# Patient Record
Sex: Female | Born: 1940 | Race: White | Hispanic: No | Smoking: Never smoker
Health system: Southern US, Community
[De-identification: ages and names within clinical notes are randomized; demographics above are authoritative.]

## PROBLEM LIST (undated history)

## (undated) HISTORY — PX: OTHER SURGICAL HISTORY: SHX169

## (undated) HISTORY — PX: ABDOMINAL HYSTERECTOMY: SHX81

## (undated) HISTORY — PX: REPLACEMENT TOTAL KNEE BILATERAL: SUR1225

---

## 2015-08-20 DIAGNOSIS — R5381 Other malaise: Secondary | ICD-10-CM | POA: Insufficient documentation

## 2015-08-20 DIAGNOSIS — E538 Deficiency of other specified B group vitamins: Secondary | ICD-10-CM | POA: Insufficient documentation

## 2015-08-20 DIAGNOSIS — I10 Essential (primary) hypertension: Secondary | ICD-10-CM | POA: Insufficient documentation

## 2015-08-20 DIAGNOSIS — E669 Obesity, unspecified: Secondary | ICD-10-CM | POA: Insufficient documentation

## 2015-08-20 DIAGNOSIS — M51369 Other intervertebral disc degeneration, lumbar region without mention of lumbar back pain or lower extremity pain: Secondary | ICD-10-CM | POA: Insufficient documentation

## 2015-08-20 DIAGNOSIS — K219 Gastro-esophageal reflux disease without esophagitis: Secondary | ICD-10-CM | POA: Insufficient documentation

## 2015-08-20 DIAGNOSIS — M7138 Other bursal cyst, other site: Secondary | ICD-10-CM | POA: Insufficient documentation

## 2015-08-20 DIAGNOSIS — F419 Anxiety disorder, unspecified: Secondary | ICD-10-CM | POA: Insufficient documentation

## 2015-08-20 DIAGNOSIS — E782 Mixed hyperlipidemia: Secondary | ICD-10-CM | POA: Insufficient documentation

## 2015-08-20 DIAGNOSIS — M5136 Other intervertebral disc degeneration, lumbar region: Secondary | ICD-10-CM | POA: Insufficient documentation

## 2015-08-20 DIAGNOSIS — Z79899 Other long term (current) drug therapy: Secondary | ICD-10-CM | POA: Insufficient documentation

## 2015-08-20 DIAGNOSIS — M159 Polyosteoarthritis, unspecified: Secondary | ICD-10-CM | POA: Insufficient documentation

## 2015-08-20 DIAGNOSIS — I493 Ventricular premature depolarization: Secondary | ICD-10-CM | POA: Insufficient documentation

## 2015-08-20 DIAGNOSIS — Z8673 Personal history of transient ischemic attack (TIA), and cerebral infarction without residual deficits: Secondary | ICD-10-CM | POA: Insufficient documentation

## 2015-08-20 DIAGNOSIS — R5383 Other fatigue: Secondary | ICD-10-CM | POA: Insufficient documentation

## 2015-09-11 DIAGNOSIS — E611 Iron deficiency: Secondary | ICD-10-CM | POA: Insufficient documentation

## 2016-03-05 DIAGNOSIS — E860 Dehydration: Secondary | ICD-10-CM | POA: Diagnosis not present

## 2016-03-05 DIAGNOSIS — C4361 Malignant melanoma of right upper limb, including shoulder: Secondary | ICD-10-CM | POA: Diagnosis not present

## 2016-09-02 DIAGNOSIS — Z1231 Encounter for screening mammogram for malignant neoplasm of breast: Secondary | ICD-10-CM | POA: Diagnosis not present

## 2016-09-08 DIAGNOSIS — Z85820 Personal history of malignant melanoma of skin: Secondary | ICD-10-CM | POA: Diagnosis not present

## 2016-09-10 DIAGNOSIS — M1711 Unilateral primary osteoarthritis, right knee: Secondary | ICD-10-CM | POA: Diagnosis not present

## 2016-09-29 DIAGNOSIS — M1711 Unilateral primary osteoarthritis, right knee: Secondary | ICD-10-CM | POA: Diagnosis not present

## 2016-10-06 DIAGNOSIS — M1711 Unilateral primary osteoarthritis, right knee: Secondary | ICD-10-CM | POA: Diagnosis not present

## 2016-10-07 DIAGNOSIS — R5381 Other malaise: Secondary | ICD-10-CM | POA: Diagnosis not present

## 2016-10-07 DIAGNOSIS — D649 Anemia, unspecified: Secondary | ICD-10-CM | POA: Diagnosis not present

## 2016-10-07 DIAGNOSIS — E782 Mixed hyperlipidemia: Secondary | ICD-10-CM | POA: Diagnosis not present

## 2016-10-07 DIAGNOSIS — Z79899 Other long term (current) drug therapy: Secondary | ICD-10-CM | POA: Diagnosis not present

## 2016-10-07 DIAGNOSIS — R7303 Prediabetes: Secondary | ICD-10-CM | POA: Insufficient documentation

## 2016-10-07 DIAGNOSIS — I493 Ventricular premature depolarization: Secondary | ICD-10-CM | POA: Diagnosis not present

## 2016-10-07 DIAGNOSIS — Z8673 Personal history of transient ischemic attack (TIA), and cerebral infarction without residual deficits: Secondary | ICD-10-CM | POA: Diagnosis not present

## 2016-10-07 DIAGNOSIS — E538 Deficiency of other specified B group vitamins: Secondary | ICD-10-CM | POA: Diagnosis not present

## 2016-10-07 DIAGNOSIS — M150 Primary generalized (osteo)arthritis: Secondary | ICD-10-CM | POA: Diagnosis not present

## 2016-10-07 DIAGNOSIS — I10 Essential (primary) hypertension: Secondary | ICD-10-CM | POA: Diagnosis not present

## 2016-10-07 DIAGNOSIS — K219 Gastro-esophageal reflux disease without esophagitis: Secondary | ICD-10-CM | POA: Diagnosis not present

## 2016-10-07 DIAGNOSIS — F411 Generalized anxiety disorder: Secondary | ICD-10-CM | POA: Diagnosis not present

## 2016-10-07 DIAGNOSIS — M5136 Other intervertebral disc degeneration, lumbar region: Secondary | ICD-10-CM | POA: Diagnosis not present

## 2016-10-07 DIAGNOSIS — E669 Obesity, unspecified: Secondary | ICD-10-CM | POA: Diagnosis not present

## 2016-10-07 DIAGNOSIS — R5383 Other fatigue: Secondary | ICD-10-CM | POA: Diagnosis not present

## 2016-10-15 DIAGNOSIS — M1711 Unilateral primary osteoarthritis, right knee: Secondary | ICD-10-CM | POA: Diagnosis not present

## 2016-11-06 DIAGNOSIS — G453 Amaurosis fugax: Secondary | ICD-10-CM | POA: Diagnosis not present

## 2016-11-06 DIAGNOSIS — H35031 Hypertensive retinopathy, right eye: Secondary | ICD-10-CM | POA: Diagnosis not present

## 2017-03-09 DIAGNOSIS — R918 Other nonspecific abnormal finding of lung field: Secondary | ICD-10-CM | POA: Diagnosis not present

## 2017-03-09 DIAGNOSIS — C4361 Malignant melanoma of right upper limb, including shoulder: Secondary | ICD-10-CM | POA: Diagnosis not present

## 2017-03-11 DIAGNOSIS — D649 Anemia, unspecified: Secondary | ICD-10-CM | POA: Diagnosis not present

## 2017-03-11 DIAGNOSIS — Z85820 Personal history of malignant melanoma of skin: Secondary | ICD-10-CM | POA: Diagnosis not present

## 2017-04-06 DIAGNOSIS — I10 Essential (primary) hypertension: Secondary | ICD-10-CM | POA: Diagnosis not present

## 2017-04-06 DIAGNOSIS — E538 Deficiency of other specified B group vitamins: Secondary | ICD-10-CM | POA: Diagnosis not present

## 2017-04-06 DIAGNOSIS — K219 Gastro-esophageal reflux disease without esophagitis: Secondary | ICD-10-CM | POA: Diagnosis not present

## 2017-04-06 DIAGNOSIS — E782 Mixed hyperlipidemia: Secondary | ICD-10-CM | POA: Diagnosis not present

## 2017-04-06 DIAGNOSIS — M5136 Other intervertebral disc degeneration, lumbar region: Secondary | ICD-10-CM | POA: Diagnosis not present

## 2017-04-06 DIAGNOSIS — M150 Primary generalized (osteo)arthritis: Secondary | ICD-10-CM | POA: Diagnosis not present

## 2017-04-18 DIAGNOSIS — M25511 Pain in right shoulder: Secondary | ICD-10-CM | POA: Diagnosis not present

## 2017-04-20 DIAGNOSIS — M549 Dorsalgia, unspecified: Secondary | ICD-10-CM | POA: Diagnosis not present

## 2017-04-20 DIAGNOSIS — I10 Essential (primary) hypertension: Secondary | ICD-10-CM | POA: Diagnosis not present

## 2017-04-21 DIAGNOSIS — M4802 Spinal stenosis, cervical region: Secondary | ICD-10-CM | POA: Diagnosis not present

## 2017-04-21 DIAGNOSIS — M5412 Radiculopathy, cervical region: Secondary | ICD-10-CM | POA: Diagnosis not present

## 2017-04-21 DIAGNOSIS — M50122 Cervical disc disorder at C5-C6 level with radiculopathy: Secondary | ICD-10-CM | POA: Diagnosis not present

## 2017-04-24 DIAGNOSIS — M5412 Radiculopathy, cervical region: Secondary | ICD-10-CM | POA: Diagnosis not present

## 2017-05-14 DIAGNOSIS — M4802 Spinal stenosis, cervical region: Secondary | ICD-10-CM | POA: Diagnosis not present

## 2017-06-01 ENCOUNTER — Other Ambulatory Visit: Payer: Self-pay | Admitting: Student

## 2017-06-01 DIAGNOSIS — M4802 Spinal stenosis, cervical region: Secondary | ICD-10-CM

## 2017-06-11 ENCOUNTER — Ambulatory Visit
Admission: RE | Admit: 2017-06-11 | Discharge: 2017-06-11 | Disposition: A | Discharge status: Home / Self Care | Payer: PPO | Source: Ambulatory Visit | Attending: Student | Admitting: Student

## 2017-06-11 DIAGNOSIS — M50122 Cervical disc disorder at C5-C6 level with radiculopathy: Secondary | ICD-10-CM | POA: Diagnosis not present

## 2017-06-11 DIAGNOSIS — M4802 Spinal stenosis, cervical region: Secondary | ICD-10-CM

## 2017-06-11 DIAGNOSIS — M50121 Cervical disc disorder at C4-C5 level with radiculopathy: Secondary | ICD-10-CM | POA: Diagnosis not present

## 2017-06-11 MED ORDER — TRIAMCINOLONE ACETONIDE 40 MG/ML IJ SUSP (RADIOLOGY)
60.0000 mg | Freq: Once | INTRAMUSCULAR | Status: AC
  Administered 2017-06-11: 60 mg via EPIDURAL

## 2017-06-11 MED ORDER — IOPAMIDOL (ISOVUE-M 300) INJECTION 61%
1.0000 mL | Freq: Once | INTRAMUSCULAR | Status: AC | PRN
  Administered 2017-06-11: 1 mL via EPIDURAL

## 2017-06-11 NOTE — Discharge Instructions (Signed)

## 2017-06-30 DIAGNOSIS — M4802 Spinal stenosis, cervical region: Secondary | ICD-10-CM | POA: Diagnosis not present

## 2017-07-15 DIAGNOSIS — M1711 Unilateral primary osteoarthritis, right knee: Secondary | ICD-10-CM | POA: Diagnosis not present

## 2017-07-24 DIAGNOSIS — M1711 Unilateral primary osteoarthritis, right knee: Secondary | ICD-10-CM | POA: Diagnosis not present

## 2017-07-31 DIAGNOSIS — M1711 Unilateral primary osteoarthritis, right knee: Secondary | ICD-10-CM | POA: Diagnosis not present

## 2017-08-24 DIAGNOSIS — M1711 Unilateral primary osteoarthritis, right knee: Secondary | ICD-10-CM | POA: Diagnosis not present

## 2017-08-27 DIAGNOSIS — M542 Cervicalgia: Secondary | ICD-10-CM | POA: Diagnosis not present

## 2017-09-01 DIAGNOSIS — H6123 Impacted cerumen, bilateral: Secondary | ICD-10-CM | POA: Diagnosis not present

## 2017-09-01 DIAGNOSIS — H6243 Otitis externa in other diseases classified elsewhere, bilateral: Secondary | ICD-10-CM | POA: Diagnosis not present

## 2017-09-01 DIAGNOSIS — J302 Other seasonal allergic rhinitis: Secondary | ICD-10-CM | POA: Diagnosis not present

## 2017-09-01 DIAGNOSIS — H9311 Tinnitus, right ear: Secondary | ICD-10-CM | POA: Diagnosis not present

## 2017-09-01 DIAGNOSIS — B369 Superficial mycosis, unspecified: Secondary | ICD-10-CM | POA: Diagnosis not present

## 2017-09-15 DIAGNOSIS — Z1231 Encounter for screening mammogram for malignant neoplasm of breast: Secondary | ICD-10-CM | POA: Diagnosis not present

## 2017-10-21 DIAGNOSIS — M1711 Unilateral primary osteoarthritis, right knee: Secondary | ICD-10-CM | POA: Diagnosis not present

## 2017-10-27 DIAGNOSIS — M5136 Other intervertebral disc degeneration, lumbar region: Secondary | ICD-10-CM | POA: Diagnosis not present

## 2017-10-27 DIAGNOSIS — M150 Primary generalized (osteo)arthritis: Secondary | ICD-10-CM | POA: Diagnosis not present

## 2017-10-27 DIAGNOSIS — I10 Essential (primary) hypertension: Secondary | ICD-10-CM | POA: Diagnosis not present

## 2017-10-27 DIAGNOSIS — K219 Gastro-esophageal reflux disease without esophagitis: Secondary | ICD-10-CM | POA: Diagnosis not present

## 2017-11-06 DIAGNOSIS — Z79899 Other long term (current) drug therapy: Secondary | ICD-10-CM | POA: Diagnosis not present

## 2017-11-06 DIAGNOSIS — Z01818 Encounter for other preprocedural examination: Secondary | ICD-10-CM | POA: Diagnosis not present

## 2017-11-06 DIAGNOSIS — I10 Essential (primary) hypertension: Secondary | ICD-10-CM | POA: Diagnosis not present

## 2017-11-06 DIAGNOSIS — R7303 Prediabetes: Secondary | ICD-10-CM | POA: Diagnosis not present

## 2017-12-07 DIAGNOSIS — R52 Pain, unspecified: Secondary | ICD-10-CM | POA: Diagnosis not present

## 2017-12-07 DIAGNOSIS — Z01818 Encounter for other preprocedural examination: Secondary | ICD-10-CM | POA: Diagnosis not present

## 2017-12-07 DIAGNOSIS — Z79899 Other long term (current) drug therapy: Secondary | ICD-10-CM | POA: Diagnosis not present

## 2017-12-07 DIAGNOSIS — M79609 Pain in unspecified limb: Secondary | ICD-10-CM | POA: Diagnosis not present

## 2017-12-09 DIAGNOSIS — M1711 Unilateral primary osteoarthritis, right knee: Secondary | ICD-10-CM | POA: Diagnosis not present

## 2017-12-15 DIAGNOSIS — E7800 Pure hypercholesterolemia, unspecified: Secondary | ICD-10-CM | POA: Diagnosis not present

## 2017-12-15 DIAGNOSIS — M549 Dorsalgia, unspecified: Secondary | ICD-10-CM | POA: Diagnosis not present

## 2017-12-15 DIAGNOSIS — E669 Obesity, unspecified: Secondary | ICD-10-CM | POA: Diagnosis not present

## 2017-12-15 DIAGNOSIS — Z79899 Other long term (current) drug therapy: Secondary | ICD-10-CM | POA: Diagnosis not present

## 2017-12-15 DIAGNOSIS — G8918 Other acute postprocedural pain: Secondary | ICD-10-CM | POA: Diagnosis not present

## 2017-12-15 DIAGNOSIS — Z7982 Long term (current) use of aspirin: Secondary | ICD-10-CM | POA: Diagnosis not present

## 2017-12-15 DIAGNOSIS — Z8673 Personal history of transient ischemic attack (TIA), and cerebral infarction without residual deficits: Secondary | ICD-10-CM | POA: Diagnosis not present

## 2017-12-15 DIAGNOSIS — T84032A Mechanical loosening of internal right knee prosthetic joint, initial encounter: Secondary | ICD-10-CM | POA: Diagnosis not present

## 2017-12-15 DIAGNOSIS — E785 Hyperlipidemia, unspecified: Secondary | ICD-10-CM | POA: Diagnosis not present

## 2017-12-15 DIAGNOSIS — Z96651 Presence of right artificial knee joint: Secondary | ICD-10-CM | POA: Diagnosis not present

## 2017-12-15 DIAGNOSIS — Z96641 Presence of right artificial hip joint: Secondary | ICD-10-CM | POA: Diagnosis not present

## 2017-12-15 DIAGNOSIS — Z7902 Long term (current) use of antithrombotics/antiplatelets: Secondary | ICD-10-CM | POA: Diagnosis not present

## 2017-12-15 DIAGNOSIS — Z880 Allergy status to penicillin: Secondary | ICD-10-CM | POA: Diagnosis not present

## 2017-12-15 DIAGNOSIS — G8929 Other chronic pain: Secondary | ICD-10-CM | POA: Diagnosis not present

## 2017-12-15 DIAGNOSIS — I10 Essential (primary) hypertension: Secondary | ICD-10-CM | POA: Diagnosis not present

## 2017-12-15 DIAGNOSIS — Z471 Aftercare following joint replacement surgery: Secondary | ICD-10-CM | POA: Diagnosis not present

## 2017-12-15 DIAGNOSIS — M1711 Unilateral primary osteoarthritis, right knee: Secondary | ICD-10-CM | POA: Diagnosis not present

## 2017-12-18 ENCOUNTER — Other Ambulatory Visit: Payer: Self-pay

## 2017-12-18 DIAGNOSIS — Z96651 Presence of right artificial knee joint: Secondary | ICD-10-CM | POA: Diagnosis not present

## 2017-12-18 DIAGNOSIS — M5412 Radiculopathy, cervical region: Secondary | ICD-10-CM | POA: Diagnosis not present

## 2017-12-18 DIAGNOSIS — Z85820 Personal history of malignant melanoma of skin: Secondary | ICD-10-CM | POA: Diagnosis not present

## 2017-12-18 DIAGNOSIS — I10 Essential (primary) hypertension: Secondary | ICD-10-CM | POA: Diagnosis not present

## 2017-12-18 DIAGNOSIS — Z90710 Acquired absence of both cervix and uterus: Secondary | ICD-10-CM | POA: Diagnosis not present

## 2017-12-18 DIAGNOSIS — Z7982 Long term (current) use of aspirin: Secondary | ICD-10-CM | POA: Diagnosis not present

## 2017-12-18 DIAGNOSIS — Z471 Aftercare following joint replacement surgery: Secondary | ICD-10-CM | POA: Diagnosis not present

## 2017-12-18 DIAGNOSIS — Z9049 Acquired absence of other specified parts of digestive tract: Secondary | ICD-10-CM | POA: Diagnosis not present

## 2017-12-18 DIAGNOSIS — Z79891 Long term (current) use of opiate analgesic: Secondary | ICD-10-CM | POA: Diagnosis not present

## 2017-12-18 DIAGNOSIS — R600 Localized edema: Secondary | ICD-10-CM | POA: Diagnosis not present

## 2017-12-18 DIAGNOSIS — Z9181 History of falling: Secondary | ICD-10-CM | POA: Diagnosis not present

## 2017-12-18 DIAGNOSIS — M549 Dorsalgia, unspecified: Secondary | ICD-10-CM | POA: Diagnosis not present

## 2017-12-18 NOTE — Patient Outreach (Signed)
[facility]) Care Management  12/18/2017  Katrina Velasquez 12-22-1940 [id number]   Referral received. No outreach warranted at this time. Transition of Care  will be completed by primary care provider office who will refer to [facility] care management if needed.  Plan: RN CM will close case.  Jone Baseman, RN, MSN [place] [facility] Care Management Coordinator Direct Line [phone] Cell [phone] Toll Free: [phone]  Fax: [phone]

## 2017-12-28 DIAGNOSIS — R2689 Other abnormalities of gait and mobility: Secondary | ICD-10-CM | POA: Diagnosis not present

## 2017-12-28 DIAGNOSIS — M25512 Pain in left shoulder: Secondary | ICD-10-CM | POA: Diagnosis not present

## 2017-12-28 DIAGNOSIS — M25662 Stiffness of left knee, not elsewhere classified: Secondary | ICD-10-CM | POA: Diagnosis not present

## 2017-12-28 DIAGNOSIS — M25561 Pain in right knee: Secondary | ICD-10-CM | POA: Diagnosis not present

## 2017-12-30 DIAGNOSIS — M25512 Pain in left shoulder: Secondary | ICD-10-CM | POA: Diagnosis not present

## 2017-12-30 DIAGNOSIS — R2689 Other abnormalities of gait and mobility: Secondary | ICD-10-CM | POA: Diagnosis not present

## 2017-12-30 DIAGNOSIS — M25561 Pain in right knee: Secondary | ICD-10-CM | POA: Diagnosis not present

## 2017-12-30 DIAGNOSIS — M25662 Stiffness of left knee, not elsewhere classified: Secondary | ICD-10-CM | POA: Diagnosis not present

## 2018-01-05 DIAGNOSIS — R2689 Other abnormalities of gait and mobility: Secondary | ICD-10-CM | POA: Diagnosis not present

## 2018-01-05 DIAGNOSIS — M25512 Pain in left shoulder: Secondary | ICD-10-CM | POA: Diagnosis not present

## 2018-01-05 DIAGNOSIS — M25561 Pain in right knee: Secondary | ICD-10-CM | POA: Diagnosis not present

## 2018-01-05 DIAGNOSIS — M25662 Stiffness of left knee, not elsewhere classified: Secondary | ICD-10-CM | POA: Diagnosis not present

## 2018-01-07 DIAGNOSIS — M25561 Pain in right knee: Secondary | ICD-10-CM | POA: Diagnosis not present

## 2018-01-07 DIAGNOSIS — R2689 Other abnormalities of gait and mobility: Secondary | ICD-10-CM | POA: Diagnosis not present

## 2018-01-07 DIAGNOSIS — M25512 Pain in left shoulder: Secondary | ICD-10-CM | POA: Diagnosis not present

## 2018-01-07 DIAGNOSIS — M25662 Stiffness of left knee, not elsewhere classified: Secondary | ICD-10-CM | POA: Diagnosis not present

## 2018-01-12 DIAGNOSIS — Z96659 Presence of unspecified artificial knee joint: Secondary | ICD-10-CM | POA: Diagnosis not present

## 2018-01-12 DIAGNOSIS — M25662 Stiffness of left knee, not elsewhere classified: Secondary | ICD-10-CM | POA: Diagnosis not present

## 2018-01-12 DIAGNOSIS — M25512 Pain in left shoulder: Secondary | ICD-10-CM | POA: Diagnosis not present

## 2018-01-12 DIAGNOSIS — R2689 Other abnormalities of gait and mobility: Secondary | ICD-10-CM | POA: Diagnosis not present

## 2018-01-12 DIAGNOSIS — M25561 Pain in right knee: Secondary | ICD-10-CM | POA: Diagnosis not present

## 2018-01-14 DIAGNOSIS — R2689 Other abnormalities of gait and mobility: Secondary | ICD-10-CM | POA: Diagnosis not present

## 2018-01-14 DIAGNOSIS — M25561 Pain in right knee: Secondary | ICD-10-CM | POA: Diagnosis not present

## 2018-01-14 DIAGNOSIS — Z96659 Presence of unspecified artificial knee joint: Secondary | ICD-10-CM | POA: Diagnosis not present

## 2018-01-14 DIAGNOSIS — M25662 Stiffness of left knee, not elsewhere classified: Secondary | ICD-10-CM | POA: Diagnosis not present

## 2018-01-14 DIAGNOSIS — M25512 Pain in left shoulder: Secondary | ICD-10-CM | POA: Diagnosis not present

## 2018-01-19 DIAGNOSIS — M25561 Pain in right knee: Secondary | ICD-10-CM | POA: Diagnosis not present

## 2018-01-19 DIAGNOSIS — M25512 Pain in left shoulder: Secondary | ICD-10-CM | POA: Diagnosis not present

## 2018-01-19 DIAGNOSIS — Z96659 Presence of unspecified artificial knee joint: Secondary | ICD-10-CM | POA: Diagnosis not present

## 2018-01-19 DIAGNOSIS — R2689 Other abnormalities of gait and mobility: Secondary | ICD-10-CM | POA: Diagnosis not present

## 2018-01-21 DIAGNOSIS — M25561 Pain in right knee: Secondary | ICD-10-CM | POA: Diagnosis not present

## 2018-01-21 DIAGNOSIS — Z96659 Presence of unspecified artificial knee joint: Secondary | ICD-10-CM | POA: Diagnosis not present

## 2018-01-21 DIAGNOSIS — M25512 Pain in left shoulder: Secondary | ICD-10-CM | POA: Diagnosis not present

## 2018-01-21 DIAGNOSIS — R2689 Other abnormalities of gait and mobility: Secondary | ICD-10-CM | POA: Diagnosis not present

## 2018-01-26 DIAGNOSIS — M25561 Pain in right knee: Secondary | ICD-10-CM | POA: Diagnosis not present

## 2018-01-26 DIAGNOSIS — Z96659 Presence of unspecified artificial knee joint: Secondary | ICD-10-CM | POA: Diagnosis not present

## 2018-01-26 DIAGNOSIS — M25512 Pain in left shoulder: Secondary | ICD-10-CM | POA: Diagnosis not present

## 2018-01-26 DIAGNOSIS — R2689 Other abnormalities of gait and mobility: Secondary | ICD-10-CM | POA: Diagnosis not present

## 2018-01-27 DIAGNOSIS — I10 Essential (primary) hypertension: Secondary | ICD-10-CM | POA: Diagnosis not present

## 2018-01-27 DIAGNOSIS — F411 Generalized anxiety disorder: Secondary | ICD-10-CM | POA: Diagnosis not present

## 2018-01-27 DIAGNOSIS — R5381 Other malaise: Secondary | ICD-10-CM | POA: Diagnosis not present

## 2018-01-27 DIAGNOSIS — R5383 Other fatigue: Secondary | ICD-10-CM | POA: Diagnosis not present

## 2018-01-27 DIAGNOSIS — Z79899 Other long term (current) drug therapy: Secondary | ICD-10-CM | POA: Diagnosis not present

## 2018-01-28 DIAGNOSIS — R5381 Other malaise: Secondary | ICD-10-CM | POA: Diagnosis not present

## 2018-01-29 DIAGNOSIS — M25512 Pain in left shoulder: Secondary | ICD-10-CM | POA: Diagnosis not present

## 2018-01-29 DIAGNOSIS — Z96659 Presence of unspecified artificial knee joint: Secondary | ICD-10-CM | POA: Diagnosis not present

## 2018-01-29 DIAGNOSIS — R2689 Other abnormalities of gait and mobility: Secondary | ICD-10-CM | POA: Diagnosis not present

## 2018-01-29 DIAGNOSIS — M25561 Pain in right knee: Secondary | ICD-10-CM | POA: Diagnosis not present

## 2018-02-01 DIAGNOSIS — Z96659 Presence of unspecified artificial knee joint: Secondary | ICD-10-CM | POA: Diagnosis not present

## 2018-02-01 DIAGNOSIS — R2689 Other abnormalities of gait and mobility: Secondary | ICD-10-CM | POA: Diagnosis not present

## 2018-02-01 DIAGNOSIS — M25512 Pain in left shoulder: Secondary | ICD-10-CM | POA: Diagnosis not present

## 2018-02-04 DIAGNOSIS — R2689 Other abnormalities of gait and mobility: Secondary | ICD-10-CM | POA: Diagnosis not present

## 2018-02-04 DIAGNOSIS — Z96659 Presence of unspecified artificial knee joint: Secondary | ICD-10-CM | POA: Diagnosis not present

## 2018-02-04 DIAGNOSIS — M25512 Pain in left shoulder: Secondary | ICD-10-CM | POA: Diagnosis not present

## 2018-02-09 DIAGNOSIS — Z96651 Presence of right artificial knee joint: Secondary | ICD-10-CM | POA: Diagnosis not present

## 2018-02-09 DIAGNOSIS — M1712 Unilateral primary osteoarthritis, left knee: Secondary | ICD-10-CM | POA: Diagnosis not present

## 2018-02-09 DIAGNOSIS — M1711 Unilateral primary osteoarthritis, right knee: Secondary | ICD-10-CM | POA: Diagnosis not present

## 2018-02-12 DIAGNOSIS — Z96659 Presence of unspecified artificial knee joint: Secondary | ICD-10-CM | POA: Diagnosis not present

## 2018-02-12 DIAGNOSIS — R2689 Other abnormalities of gait and mobility: Secondary | ICD-10-CM | POA: Diagnosis not present

## 2018-02-12 DIAGNOSIS — M25512 Pain in left shoulder: Secondary | ICD-10-CM | POA: Diagnosis not present

## 2018-02-17 DIAGNOSIS — R2689 Other abnormalities of gait and mobility: Secondary | ICD-10-CM | POA: Diagnosis not present

## 2018-02-17 DIAGNOSIS — M25512 Pain in left shoulder: Secondary | ICD-10-CM | POA: Diagnosis not present

## 2018-02-17 DIAGNOSIS — Z96659 Presence of unspecified artificial knee joint: Secondary | ICD-10-CM | POA: Diagnosis not present

## 2018-02-23 DIAGNOSIS — Z96659 Presence of unspecified artificial knee joint: Secondary | ICD-10-CM | POA: Diagnosis not present

## 2018-02-23 DIAGNOSIS — R2689 Other abnormalities of gait and mobility: Secondary | ICD-10-CM | POA: Diagnosis not present

## 2018-02-23 DIAGNOSIS — M25512 Pain in left shoulder: Secondary | ICD-10-CM | POA: Diagnosis not present

## 2018-03-11 DIAGNOSIS — Z85820 Personal history of malignant melanoma of skin: Secondary | ICD-10-CM

## 2018-03-31 DIAGNOSIS — M25572 Pain in left ankle and joints of left foot: Secondary | ICD-10-CM | POA: Diagnosis not present

## 2018-03-31 DIAGNOSIS — M25571 Pain in right ankle and joints of right foot: Secondary | ICD-10-CM | POA: Diagnosis not present

## 2018-03-31 DIAGNOSIS — M25561 Pain in right knee: Secondary | ICD-10-CM | POA: Diagnosis not present

## 2018-03-31 DIAGNOSIS — M25562 Pain in left knee: Secondary | ICD-10-CM | POA: Diagnosis not present

## 2018-03-31 DIAGNOSIS — R2689 Other abnormalities of gait and mobility: Secondary | ICD-10-CM | POA: Diagnosis not present

## 2018-04-05 DIAGNOSIS — M1712 Unilateral primary osteoarthritis, left knee: Secondary | ICD-10-CM | POA: Diagnosis not present

## 2018-04-14 DIAGNOSIS — M1712 Unilateral primary osteoarthritis, left knee: Secondary | ICD-10-CM | POA: Diagnosis not present

## 2018-04-19 DIAGNOSIS — M25562 Pain in left knee: Secondary | ICD-10-CM | POA: Diagnosis not present

## 2018-04-19 DIAGNOSIS — M1712 Unilateral primary osteoarthritis, left knee: Secondary | ICD-10-CM | POA: Diagnosis not present

## 2018-04-19 DIAGNOSIS — M25662 Stiffness of left knee, not elsewhere classified: Secondary | ICD-10-CM | POA: Diagnosis not present

## 2018-04-19 DIAGNOSIS — R2689 Other abnormalities of gait and mobility: Secondary | ICD-10-CM | POA: Diagnosis not present

## 2018-04-21 DIAGNOSIS — M1712 Unilateral primary osteoarthritis, left knee: Secondary | ICD-10-CM | POA: Diagnosis not present

## 2018-05-02 IMAGING — XA DG INJECT/[PERSON_NAME] INC NEEDLE/CATH/PLC EPI/CERV/THOR W/IMG
2 series · 2 of 2 positions shown · non-contrast
Comparison: none

CLINICAL DATA: Right upper extremity radiculopathy. Displacement of
the C4-5 and C5-6 cervical disc.

[Series 1: ortho standard · 1 of 1 slices shown (1 of 2)]
[im 1/1]
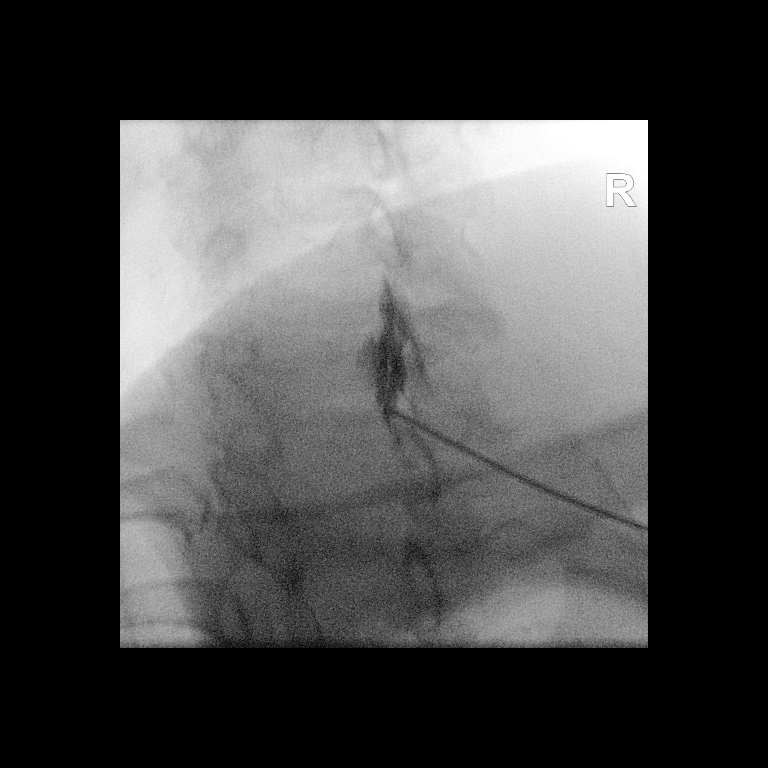

[Series 2: ortho standard · 1 of 1 slices shown (2 of 2)]
[im 1/1]
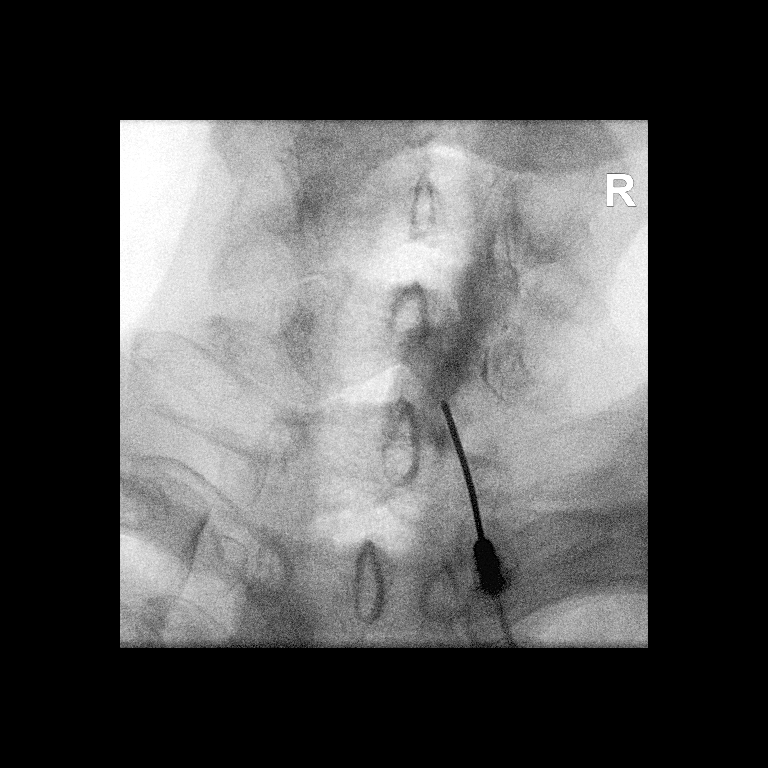

[2 of 2 positions shown; findings below may reference images not displayed]

FLUOROSCOPY TIME:  Radiation Exposure Index (as provided by the
fluoroscopic device): 9.66 uGy*m2

Fluoroscopy Time:  22 seconds

Number of Acquired Images:  0

PROCEDURE:
CERVICAL EPIDURAL INJECTION

An interlaminar approach was performed on the right at C7-T1. A 20
gauge epidural needle was advanced using loss-of-resistance
technique.

DIAGNOSTIC EPIDURAL INJECTION

Injection of Isovue-M 300 shows a good epidural pattern with spread
above and below the level of needle placement, primarily on the
right. No vascular opacification is seen. THERAPEUTIC

EPIDURAL INJECTION

1.5 ml of Kenalog 40 mixed with 1 ml of 1% Lidocaine and 2 ml of
normal saline were then instilled. The procedure was well-tolerated,
and the patient was discharged thirty minutes following the
injection in good condition.
IMPRESSION: Technically successful first epidural injection on the right at
C7-T1.

## 2018-05-04 DIAGNOSIS — R2689 Other abnormalities of gait and mobility: Secondary | ICD-10-CM | POA: Diagnosis not present

## 2018-05-04 DIAGNOSIS — M25662 Stiffness of left knee, not elsewhere classified: Secondary | ICD-10-CM | POA: Diagnosis not present

## 2018-05-04 DIAGNOSIS — M25562 Pain in left knee: Secondary | ICD-10-CM | POA: Diagnosis not present

## 2018-05-12 DIAGNOSIS — M79662 Pain in left lower leg: Secondary | ICD-10-CM | POA: Diagnosis not present

## 2018-05-12 DIAGNOSIS — S0990XA Unspecified injury of head, initial encounter: Secondary | ICD-10-CM | POA: Diagnosis not present

## 2018-05-12 DIAGNOSIS — S81812A Laceration without foreign body, left lower leg, initial encounter: Secondary | ICD-10-CM | POA: Diagnosis not present

## 2018-05-12 DIAGNOSIS — Z7902 Long term (current) use of antithrombotics/antiplatelets: Secondary | ICD-10-CM | POA: Diagnosis not present

## 2018-06-14 DIAGNOSIS — M1712 Unilateral primary osteoarthritis, left knee: Secondary | ICD-10-CM | POA: Diagnosis not present

## 2018-06-14 DIAGNOSIS — M1711 Unilateral primary osteoarthritis, right knee: Secondary | ICD-10-CM | POA: Diagnosis not present

## 2018-06-14 DIAGNOSIS — Z96651 Presence of right artificial knee joint: Secondary | ICD-10-CM | POA: Diagnosis not present

## 2018-07-20 DIAGNOSIS — M25562 Pain in left knee: Secondary | ICD-10-CM | POA: Diagnosis not present

## 2018-07-21 DIAGNOSIS — M25562 Pain in left knee: Secondary | ICD-10-CM | POA: Diagnosis not present

## 2018-07-21 DIAGNOSIS — M1712 Unilateral primary osteoarthritis, left knee: Secondary | ICD-10-CM | POA: Diagnosis not present

## 2018-08-11 DIAGNOSIS — R252 Cramp and spasm: Secondary | ICD-10-CM | POA: Insufficient documentation

## 2018-08-11 DIAGNOSIS — I10 Essential (primary) hypertension: Secondary | ICD-10-CM | POA: Diagnosis not present

## 2018-08-11 DIAGNOSIS — R5383 Other fatigue: Secondary | ICD-10-CM | POA: Diagnosis not present

## 2018-08-11 DIAGNOSIS — R5381 Other malaise: Secondary | ICD-10-CM | POA: Diagnosis not present

## 2018-08-25 DIAGNOSIS — J449 Chronic obstructive pulmonary disease, unspecified: Secondary | ICD-10-CM | POA: Diagnosis not present

## 2018-08-25 DIAGNOSIS — Z7984 Long term (current) use of oral hypoglycemic drugs: Secondary | ICD-10-CM | POA: Diagnosis not present

## 2018-08-25 DIAGNOSIS — Z955 Presence of coronary angioplasty implant and graft: Secondary | ICD-10-CM | POA: Diagnosis not present

## 2018-08-25 DIAGNOSIS — E11622 Type 2 diabetes mellitus with other skin ulcer: Secondary | ICD-10-CM | POA: Diagnosis not present

## 2018-08-25 DIAGNOSIS — I10 Essential (primary) hypertension: Secondary | ICD-10-CM | POA: Diagnosis not present

## 2018-08-25 DIAGNOSIS — Z7982 Long term (current) use of aspirin: Secondary | ICD-10-CM | POA: Diagnosis not present

## 2018-08-25 DIAGNOSIS — L97922 Non-pressure chronic ulcer of unspecified part of left lower leg with fat layer exposed: Secondary | ICD-10-CM | POA: Diagnosis not present

## 2018-08-25 DIAGNOSIS — I2510 Atherosclerotic heart disease of native coronary artery without angina pectoris: Secondary | ICD-10-CM | POA: Diagnosis not present

## 2018-08-25 DIAGNOSIS — Z7902 Long term (current) use of antithrombotics/antiplatelets: Secondary | ICD-10-CM | POA: Diagnosis not present

## 2018-08-25 DIAGNOSIS — E1151 Type 2 diabetes mellitus with diabetic peripheral angiopathy without gangrene: Secondary | ICD-10-CM | POA: Diagnosis not present

## 2018-08-25 DIAGNOSIS — Z87891 Personal history of nicotine dependence: Secondary | ICD-10-CM | POA: Diagnosis not present

## 2018-08-25 DIAGNOSIS — J0191 Acute recurrent sinusitis, unspecified: Secondary | ICD-10-CM | POA: Diagnosis not present

## 2018-08-25 DIAGNOSIS — Z79899 Other long term (current) drug therapy: Secondary | ICD-10-CM | POA: Diagnosis not present

## 2018-10-01 DIAGNOSIS — Z1231 Encounter for screening mammogram for malignant neoplasm of breast: Secondary | ICD-10-CM | POA: Diagnosis not present

## 2018-10-04 DIAGNOSIS — M25562 Pain in left knee: Secondary | ICD-10-CM | POA: Diagnosis not present

## 2018-10-04 DIAGNOSIS — M1712 Unilateral primary osteoarthritis, left knee: Secondary | ICD-10-CM | POA: Diagnosis not present

## 2018-11-12 DIAGNOSIS — J0191 Acute recurrent sinusitis, unspecified: Secondary | ICD-10-CM | POA: Diagnosis not present

## 2018-11-17 DIAGNOSIS — M1712 Unilateral primary osteoarthritis, left knee: Secondary | ICD-10-CM | POA: Diagnosis not present

## 2018-11-24 DIAGNOSIS — M1712 Unilateral primary osteoarthritis, left knee: Secondary | ICD-10-CM | POA: Diagnosis not present

## 2018-12-01 DIAGNOSIS — M1712 Unilateral primary osteoarthritis, left knee: Secondary | ICD-10-CM | POA: Diagnosis not present

## 2018-12-06 DIAGNOSIS — F411 Generalized anxiety disorder: Secondary | ICD-10-CM | POA: Diagnosis not present

## 2018-12-06 DIAGNOSIS — K219 Gastro-esophageal reflux disease without esophagitis: Secondary | ICD-10-CM | POA: Diagnosis not present

## 2018-12-06 DIAGNOSIS — R5383 Other fatigue: Secondary | ICD-10-CM | POA: Diagnosis not present

## 2018-12-06 DIAGNOSIS — F5101 Primary insomnia: Secondary | ICD-10-CM | POA: Diagnosis not present

## 2018-12-06 DIAGNOSIS — I10 Essential (primary) hypertension: Secondary | ICD-10-CM | POA: Diagnosis not present

## 2018-12-06 DIAGNOSIS — M8949 Other hypertrophic osteoarthropathy, multiple sites: Secondary | ICD-10-CM | POA: Diagnosis not present

## 2018-12-06 DIAGNOSIS — R5381 Other malaise: Secondary | ICD-10-CM | POA: Diagnosis not present

## 2019-01-06 DIAGNOSIS — I10 Essential (primary) hypertension: Secondary | ICD-10-CM | POA: Diagnosis not present

## 2019-01-06 DIAGNOSIS — Z01818 Encounter for other preprocedural examination: Secondary | ICD-10-CM | POA: Diagnosis not present

## 2019-01-06 DIAGNOSIS — K219 Gastro-esophageal reflux disease without esophagitis: Secondary | ICD-10-CM | POA: Diagnosis not present

## 2019-01-06 DIAGNOSIS — M8949 Other hypertrophic osteoarthropathy, multiple sites: Secondary | ICD-10-CM | POA: Diagnosis not present

## 2019-01-06 DIAGNOSIS — I493 Ventricular premature depolarization: Secondary | ICD-10-CM | POA: Diagnosis not present

## 2019-01-17 DIAGNOSIS — M1712 Unilateral primary osteoarthritis, left knee: Secondary | ICD-10-CM | POA: Diagnosis not present

## 2019-01-25 DIAGNOSIS — M79609 Pain in unspecified limb: Secondary | ICD-10-CM | POA: Diagnosis not present

## 2019-01-25 DIAGNOSIS — R52 Pain, unspecified: Secondary | ICD-10-CM | POA: Diagnosis not present

## 2019-01-25 DIAGNOSIS — Z79899 Other long term (current) drug therapy: Secondary | ICD-10-CM | POA: Diagnosis not present

## 2019-01-25 DIAGNOSIS — Z01818 Encounter for other preprocedural examination: Secondary | ICD-10-CM | POA: Diagnosis not present

## 2019-01-28 DIAGNOSIS — D649 Anemia, unspecified: Secondary | ICD-10-CM | POA: Diagnosis not present

## 2019-02-08 DIAGNOSIS — I10 Essential (primary) hypertension: Secondary | ICD-10-CM | POA: Diagnosis not present

## 2019-02-08 DIAGNOSIS — Z8673 Personal history of transient ischemic attack (TIA), and cerebral infarction without residual deficits: Secondary | ICD-10-CM | POA: Diagnosis not present

## 2019-02-08 DIAGNOSIS — G629 Polyneuropathy, unspecified: Secondary | ICD-10-CM | POA: Diagnosis not present

## 2019-02-08 DIAGNOSIS — G8929 Other chronic pain: Secondary | ICD-10-CM | POA: Diagnosis not present

## 2019-02-08 DIAGNOSIS — Z9181 History of falling: Secondary | ICD-10-CM | POA: Diagnosis not present

## 2019-02-08 DIAGNOSIS — G8918 Other acute postprocedural pain: Secondary | ICD-10-CM | POA: Diagnosis not present

## 2019-02-08 DIAGNOSIS — Z7902 Long term (current) use of antithrombotics/antiplatelets: Secondary | ICD-10-CM | POA: Diagnosis not present

## 2019-02-08 DIAGNOSIS — R42 Dizziness and giddiness: Secondary | ICD-10-CM | POA: Diagnosis not present

## 2019-02-08 DIAGNOSIS — I951 Orthostatic hypotension: Secondary | ICD-10-CM | POA: Diagnosis not present

## 2019-02-08 DIAGNOSIS — E871 Hypo-osmolality and hyponatremia: Secondary | ICD-10-CM | POA: Diagnosis not present

## 2019-02-08 DIAGNOSIS — M1712 Unilateral primary osteoarthritis, left knee: Secondary | ICD-10-CM | POA: Diagnosis not present

## 2019-02-08 DIAGNOSIS — Z79891 Long term (current) use of opiate analgesic: Secondary | ICD-10-CM | POA: Diagnosis not present

## 2019-02-08 DIAGNOSIS — Z96652 Presence of left artificial knee joint: Secondary | ICD-10-CM | POA: Diagnosis not present

## 2019-02-08 DIAGNOSIS — N289 Disorder of kidney and ureter, unspecified: Secondary | ICD-10-CM | POA: Diagnosis not present

## 2019-02-08 DIAGNOSIS — E669 Obesity, unspecified: Secondary | ICD-10-CM | POA: Diagnosis not present

## 2019-02-08 DIAGNOSIS — T502X5A Adverse effect of carbonic-anhydrase inhibitors, benzothiadiazides and other diuretics, initial encounter: Secondary | ICD-10-CM | POA: Diagnosis not present

## 2019-02-09 DIAGNOSIS — I951 Orthostatic hypotension: Secondary | ICD-10-CM | POA: Diagnosis not present

## 2019-02-09 DIAGNOSIS — E871 Hypo-osmolality and hyponatremia: Secondary | ICD-10-CM | POA: Diagnosis not present

## 2019-02-09 DIAGNOSIS — I10 Essential (primary) hypertension: Secondary | ICD-10-CM | POA: Diagnosis not present

## 2019-02-09 DIAGNOSIS — M1712 Unilateral primary osteoarthritis, left knee: Secondary | ICD-10-CM | POA: Diagnosis not present

## 2019-02-10 DIAGNOSIS — M1712 Unilateral primary osteoarthritis, left knee: Secondary | ICD-10-CM | POA: Diagnosis not present

## 2019-02-10 DIAGNOSIS — E871 Hypo-osmolality and hyponatremia: Secondary | ICD-10-CM | POA: Diagnosis not present

## 2019-02-10 DIAGNOSIS — I951 Orthostatic hypotension: Secondary | ICD-10-CM | POA: Diagnosis not present

## 2019-02-10 DIAGNOSIS — I10 Essential (primary) hypertension: Secondary | ICD-10-CM | POA: Diagnosis not present

## 2019-02-11 DIAGNOSIS — Z471 Aftercare following joint replacement surgery: Secondary | ICD-10-CM | POA: Diagnosis not present

## 2019-02-11 DIAGNOSIS — Z791 Long term (current) use of non-steroidal anti-inflammatories (NSAID): Secondary | ICD-10-CM | POA: Diagnosis not present

## 2019-02-11 DIAGNOSIS — Z9049 Acquired absence of other specified parts of digestive tract: Secondary | ICD-10-CM | POA: Diagnosis not present

## 2019-02-11 DIAGNOSIS — Z9181 History of falling: Secondary | ICD-10-CM | POA: Diagnosis not present

## 2019-02-11 DIAGNOSIS — G629 Polyneuropathy, unspecified: Secondary | ICD-10-CM | POA: Diagnosis not present

## 2019-02-11 DIAGNOSIS — Z96651 Presence of right artificial knee joint: Secondary | ICD-10-CM | POA: Diagnosis not present

## 2019-02-11 DIAGNOSIS — Z7902 Long term (current) use of antithrombotics/antiplatelets: Secondary | ICD-10-CM | POA: Diagnosis not present

## 2019-02-11 DIAGNOSIS — Z85820 Personal history of malignant melanoma of skin: Secondary | ICD-10-CM | POA: Diagnosis not present

## 2019-02-11 DIAGNOSIS — Z79891 Long term (current) use of opiate analgesic: Secondary | ICD-10-CM | POA: Diagnosis not present

## 2019-02-11 DIAGNOSIS — I10 Essential (primary) hypertension: Secondary | ICD-10-CM | POA: Diagnosis not present

## 2019-02-11 DIAGNOSIS — Z96652 Presence of left artificial knee joint: Secondary | ICD-10-CM | POA: Diagnosis not present

## 2019-02-11 DIAGNOSIS — R600 Localized edema: Secondary | ICD-10-CM | POA: Diagnosis not present

## 2019-02-11 DIAGNOSIS — Z7982 Long term (current) use of aspirin: Secondary | ICD-10-CM | POA: Diagnosis not present

## 2019-02-11 DIAGNOSIS — Z79899 Other long term (current) drug therapy: Secondary | ICD-10-CM | POA: Diagnosis not present

## 2019-02-11 DIAGNOSIS — G8929 Other chronic pain: Secondary | ICD-10-CM | POA: Diagnosis not present

## 2019-02-11 DIAGNOSIS — Z8673 Personal history of transient ischemic attack (TIA), and cerebral infarction without residual deficits: Secondary | ICD-10-CM | POA: Diagnosis not present

## 2019-02-11 DIAGNOSIS — Z90710 Acquired absence of both cervix and uterus: Secondary | ICD-10-CM | POA: Diagnosis not present

## 2019-02-16 DIAGNOSIS — D649 Anemia, unspecified: Secondary | ICD-10-CM | POA: Diagnosis not present

## 2019-02-23 DIAGNOSIS — M25562 Pain in left knee: Secondary | ICD-10-CM | POA: Diagnosis not present

## 2019-02-23 DIAGNOSIS — M25662 Stiffness of left knee, not elsewhere classified: Secondary | ICD-10-CM | POA: Diagnosis not present

## 2019-02-23 DIAGNOSIS — R2689 Other abnormalities of gait and mobility: Secondary | ICD-10-CM | POA: Diagnosis not present

## 2019-02-24 DIAGNOSIS — R7303 Prediabetes: Secondary | ICD-10-CM | POA: Diagnosis not present

## 2019-02-24 DIAGNOSIS — E611 Iron deficiency: Secondary | ICD-10-CM | POA: Diagnosis not present

## 2019-02-24 DIAGNOSIS — Z96652 Presence of left artificial knee joint: Secondary | ICD-10-CM | POA: Insufficient documentation

## 2019-02-24 DIAGNOSIS — E782 Mixed hyperlipidemia: Secondary | ICD-10-CM | POA: Diagnosis not present

## 2019-02-24 DIAGNOSIS — R5381 Other malaise: Secondary | ICD-10-CM | POA: Diagnosis not present

## 2019-02-24 DIAGNOSIS — E538 Deficiency of other specified B group vitamins: Secondary | ICD-10-CM | POA: Diagnosis not present

## 2019-02-24 DIAGNOSIS — K219 Gastro-esophageal reflux disease without esophagitis: Secondary | ICD-10-CM | POA: Diagnosis not present

## 2019-02-24 DIAGNOSIS — F5101 Primary insomnia: Secondary | ICD-10-CM | POA: Diagnosis not present

## 2019-02-24 DIAGNOSIS — I10 Essential (primary) hypertension: Secondary | ICD-10-CM | POA: Diagnosis not present

## 2019-02-24 DIAGNOSIS — F411 Generalized anxiety disorder: Secondary | ICD-10-CM | POA: Diagnosis not present

## 2019-02-24 DIAGNOSIS — R5383 Other fatigue: Secondary | ICD-10-CM | POA: Diagnosis not present

## 2019-02-28 DIAGNOSIS — M25662 Stiffness of left knee, not elsewhere classified: Secondary | ICD-10-CM | POA: Diagnosis not present

## 2019-02-28 DIAGNOSIS — R2689 Other abnormalities of gait and mobility: Secondary | ICD-10-CM | POA: Diagnosis not present

## 2019-02-28 DIAGNOSIS — M25562 Pain in left knee: Secondary | ICD-10-CM | POA: Diagnosis not present

## 2019-03-03 DIAGNOSIS — R2689 Other abnormalities of gait and mobility: Secondary | ICD-10-CM | POA: Diagnosis not present

## 2019-03-03 DIAGNOSIS — M25662 Stiffness of left knee, not elsewhere classified: Secondary | ICD-10-CM | POA: Diagnosis not present

## 2019-03-03 DIAGNOSIS — M25562 Pain in left knee: Secondary | ICD-10-CM | POA: Diagnosis not present

## 2019-03-07 DIAGNOSIS — M25662 Stiffness of left knee, not elsewhere classified: Secondary | ICD-10-CM | POA: Diagnosis not present

## 2019-03-07 DIAGNOSIS — M25562 Pain in left knee: Secondary | ICD-10-CM | POA: Diagnosis not present

## 2019-03-07 DIAGNOSIS — R2689 Other abnormalities of gait and mobility: Secondary | ICD-10-CM | POA: Diagnosis not present

## 2019-03-10 DIAGNOSIS — M25562 Pain in left knee: Secondary | ICD-10-CM | POA: Diagnosis not present

## 2019-03-10 DIAGNOSIS — R2689 Other abnormalities of gait and mobility: Secondary | ICD-10-CM | POA: Diagnosis not present

## 2019-03-10 DIAGNOSIS — M25662 Stiffness of left knee, not elsewhere classified: Secondary | ICD-10-CM | POA: Diagnosis not present

## 2019-03-14 DIAGNOSIS — M25562 Pain in left knee: Secondary | ICD-10-CM | POA: Diagnosis not present

## 2019-03-14 DIAGNOSIS — M25662 Stiffness of left knee, not elsewhere classified: Secondary | ICD-10-CM | POA: Diagnosis not present

## 2019-03-14 DIAGNOSIS — R2689 Other abnormalities of gait and mobility: Secondary | ICD-10-CM | POA: Diagnosis not present

## 2019-03-15 DIAGNOSIS — Z85820 Personal history of malignant melanoma of skin: Secondary | ICD-10-CM | POA: Diagnosis not present

## 2019-03-17 DIAGNOSIS — M25662 Stiffness of left knee, not elsewhere classified: Secondary | ICD-10-CM | POA: Diagnosis not present

## 2019-03-17 DIAGNOSIS — M25562 Pain in left knee: Secondary | ICD-10-CM | POA: Diagnosis not present

## 2019-03-17 DIAGNOSIS — R2689 Other abnormalities of gait and mobility: Secondary | ICD-10-CM | POA: Diagnosis not present

## 2019-03-21 DIAGNOSIS — R2689 Other abnormalities of gait and mobility: Secondary | ICD-10-CM | POA: Diagnosis not present

## 2019-03-21 DIAGNOSIS — M25662 Stiffness of left knee, not elsewhere classified: Secondary | ICD-10-CM | POA: Diagnosis not present

## 2019-03-21 DIAGNOSIS — M25562 Pain in left knee: Secondary | ICD-10-CM | POA: Diagnosis not present

## 2019-03-23 DIAGNOSIS — M25662 Stiffness of left knee, not elsewhere classified: Secondary | ICD-10-CM | POA: Diagnosis not present

## 2019-03-23 DIAGNOSIS — M1712 Unilateral primary osteoarthritis, left knee: Secondary | ICD-10-CM | POA: Diagnosis not present

## 2019-03-23 DIAGNOSIS — M25562 Pain in left knee: Secondary | ICD-10-CM | POA: Diagnosis not present

## 2019-03-23 DIAGNOSIS — R2689 Other abnormalities of gait and mobility: Secondary | ICD-10-CM | POA: Diagnosis not present

## 2019-03-23 DIAGNOSIS — Z96659 Presence of unspecified artificial knee joint: Secondary | ICD-10-CM | POA: Diagnosis not present

## 2019-03-28 DIAGNOSIS — R2689 Other abnormalities of gait and mobility: Secondary | ICD-10-CM | POA: Diagnosis not present

## 2019-03-28 DIAGNOSIS — M25562 Pain in left knee: Secondary | ICD-10-CM | POA: Diagnosis not present

## 2019-03-28 DIAGNOSIS — M25662 Stiffness of left knee, not elsewhere classified: Secondary | ICD-10-CM | POA: Diagnosis not present

## 2019-03-30 DIAGNOSIS — M25662 Stiffness of left knee, not elsewhere classified: Secondary | ICD-10-CM | POA: Diagnosis not present

## 2019-03-30 DIAGNOSIS — M25562 Pain in left knee: Secondary | ICD-10-CM | POA: Diagnosis not present

## 2019-03-30 DIAGNOSIS — R2689 Other abnormalities of gait and mobility: Secondary | ICD-10-CM | POA: Diagnosis not present

## 2019-04-06 DIAGNOSIS — R2689 Other abnormalities of gait and mobility: Secondary | ICD-10-CM | POA: Diagnosis not present

## 2019-04-06 DIAGNOSIS — M25662 Stiffness of left knee, not elsewhere classified: Secondary | ICD-10-CM | POA: Diagnosis not present

## 2019-04-06 DIAGNOSIS — M25562 Pain in left knee: Secondary | ICD-10-CM | POA: Diagnosis not present

## 2019-04-12 DIAGNOSIS — R2689 Other abnormalities of gait and mobility: Secondary | ICD-10-CM | POA: Diagnosis not present

## 2019-04-12 DIAGNOSIS — M25662 Stiffness of left knee, not elsewhere classified: Secondary | ICD-10-CM | POA: Diagnosis not present

## 2019-04-12 DIAGNOSIS — M25562 Pain in left knee: Secondary | ICD-10-CM | POA: Diagnosis not present

## 2019-06-27 DIAGNOSIS — R519 Headache, unspecified: Secondary | ICD-10-CM | POA: Diagnosis not present

## 2019-06-27 DIAGNOSIS — Z20828 Contact with and (suspected) exposure to other viral communicable diseases: Secondary | ICD-10-CM | POA: Diagnosis not present

## 2019-06-27 DIAGNOSIS — R05 Cough: Secondary | ICD-10-CM | POA: Diagnosis not present

## 2019-08-03 DIAGNOSIS — M1712 Unilateral primary osteoarthritis, left knee: Secondary | ICD-10-CM | POA: Diagnosis not present

## 2019-08-25 DIAGNOSIS — E669 Obesity, unspecified: Secondary | ICD-10-CM | POA: Diagnosis not present

## 2019-08-25 DIAGNOSIS — Z0000 Encounter for general adult medical examination without abnormal findings: Secondary | ICD-10-CM | POA: Diagnosis not present

## 2019-08-25 DIAGNOSIS — K219 Gastro-esophageal reflux disease without esophagitis: Secondary | ICD-10-CM | POA: Diagnosis not present

## 2019-08-25 DIAGNOSIS — E782 Mixed hyperlipidemia: Secondary | ICD-10-CM | POA: Diagnosis not present

## 2019-08-25 DIAGNOSIS — M5136 Other intervertebral disc degeneration, lumbar region: Secondary | ICD-10-CM | POA: Diagnosis not present

## 2019-08-25 DIAGNOSIS — F5101 Primary insomnia: Secondary | ICD-10-CM | POA: Diagnosis not present

## 2019-08-25 DIAGNOSIS — Z96652 Presence of left artificial knee joint: Secondary | ICD-10-CM | POA: Diagnosis not present

## 2019-08-25 DIAGNOSIS — R5381 Other malaise: Secondary | ICD-10-CM | POA: Diagnosis not present

## 2019-08-25 DIAGNOSIS — Z79899 Other long term (current) drug therapy: Secondary | ICD-10-CM | POA: Diagnosis not present

## 2019-08-25 DIAGNOSIS — R7303 Prediabetes: Secondary | ICD-10-CM | POA: Diagnosis not present

## 2019-08-25 DIAGNOSIS — M8949 Other hypertrophic osteoarthropathy, multiple sites: Secondary | ICD-10-CM | POA: Diagnosis not present

## 2019-08-25 DIAGNOSIS — R5383 Other fatigue: Secondary | ICD-10-CM | POA: Diagnosis not present

## 2019-08-25 DIAGNOSIS — E611 Iron deficiency: Secondary | ICD-10-CM | POA: Diagnosis not present

## 2019-08-25 DIAGNOSIS — I10 Essential (primary) hypertension: Secondary | ICD-10-CM | POA: Diagnosis not present

## 2019-08-25 DIAGNOSIS — F411 Generalized anxiety disorder: Secondary | ICD-10-CM | POA: Diagnosis not present

## 2019-10-06 DIAGNOSIS — Z1231 Encounter for screening mammogram for malignant neoplasm of breast: Secondary | ICD-10-CM | POA: Diagnosis not present

## 2019-10-23 DIAGNOSIS — K529 Noninfective gastroenteritis and colitis, unspecified: Secondary | ICD-10-CM | POA: Diagnosis not present

## 2019-10-23 DIAGNOSIS — I700 Atherosclerosis of aorta: Secondary | ICD-10-CM | POA: Diagnosis not present

## 2019-10-27 DIAGNOSIS — R5381 Other malaise: Secondary | ICD-10-CM | POA: Diagnosis not present

## 2019-10-27 DIAGNOSIS — K599 Functional intestinal disorder, unspecified: Secondary | ICD-10-CM | POA: Diagnosis not present

## 2019-10-27 DIAGNOSIS — I10 Essential (primary) hypertension: Secondary | ICD-10-CM | POA: Diagnosis not present

## 2019-10-27 DIAGNOSIS — E538 Deficiency of other specified B group vitamins: Secondary | ICD-10-CM | POA: Diagnosis not present

## 2019-10-27 DIAGNOSIS — R195 Other fecal abnormalities: Secondary | ICD-10-CM | POA: Diagnosis not present

## 2019-10-27 DIAGNOSIS — R197 Diarrhea, unspecified: Secondary | ICD-10-CM | POA: Diagnosis not present

## 2019-10-27 DIAGNOSIS — R5383 Other fatigue: Secondary | ICD-10-CM | POA: Diagnosis not present

## 2019-11-15 DIAGNOSIS — T8484XA Pain due to internal orthopedic prosthetic devices, implants and grafts, initial encounter: Secondary | ICD-10-CM | POA: Diagnosis not present

## 2019-11-15 DIAGNOSIS — Z96652 Presence of left artificial knee joint: Secondary | ICD-10-CM | POA: Diagnosis not present

## 2019-11-29 DIAGNOSIS — D649 Anemia, unspecified: Secondary | ICD-10-CM | POA: Diagnosis not present

## 2019-11-29 DIAGNOSIS — Z1211 Encounter for screening for malignant neoplasm of colon: Secondary | ICD-10-CM | POA: Diagnosis not present

## 2019-11-29 DIAGNOSIS — K529 Noninfective gastroenteritis and colitis, unspecified: Secondary | ICD-10-CM | POA: Diagnosis not present

## 2019-11-29 DIAGNOSIS — R197 Diarrhea, unspecified: Secondary | ICD-10-CM | POA: Diagnosis not present

## 2019-12-06 DIAGNOSIS — Z1211 Encounter for screening for malignant neoplasm of colon: Secondary | ICD-10-CM | POA: Diagnosis not present

## 2019-12-06 DIAGNOSIS — K529 Noninfective gastroenteritis and colitis, unspecified: Secondary | ICD-10-CM | POA: Diagnosis not present

## 2019-12-27 DIAGNOSIS — M7062 Trochanteric bursitis, left hip: Secondary | ICD-10-CM | POA: Diagnosis not present

## 2020-01-03 DIAGNOSIS — R252 Cramp and spasm: Secondary | ICD-10-CM | POA: Diagnosis not present

## 2020-01-03 DIAGNOSIS — R5383 Other fatigue: Secondary | ICD-10-CM | POA: Diagnosis not present

## 2020-01-03 DIAGNOSIS — I10 Essential (primary) hypertension: Secondary | ICD-10-CM | POA: Diagnosis not present

## 2020-01-03 DIAGNOSIS — M8949 Other hypertrophic osteoarthropathy, multiple sites: Secondary | ICD-10-CM | POA: Diagnosis not present

## 2020-01-03 DIAGNOSIS — R5381 Other malaise: Secondary | ICD-10-CM | POA: Diagnosis not present

## 2020-01-07 DIAGNOSIS — Z20828 Contact with and (suspected) exposure to other viral communicable diseases: Secondary | ICD-10-CM | POA: Diagnosis not present

## 2020-01-07 DIAGNOSIS — J069 Acute upper respiratory infection, unspecified: Secondary | ICD-10-CM | POA: Diagnosis not present

## 2020-01-07 DIAGNOSIS — R05 Cough: Secondary | ICD-10-CM | POA: Diagnosis not present

## 2020-01-15 DIAGNOSIS — M5136 Other intervertebral disc degeneration, lumbar region: Secondary | ICD-10-CM | POA: Diagnosis not present

## 2020-01-15 DIAGNOSIS — R519 Headache, unspecified: Secondary | ICD-10-CM | POA: Diagnosis not present

## 2020-01-15 DIAGNOSIS — A0472 Enterocolitis due to Clostridium difficile, not specified as recurrent: Secondary | ICD-10-CM | POA: Diagnosis not present

## 2020-01-15 DIAGNOSIS — R197 Diarrhea, unspecified: Secondary | ICD-10-CM | POA: Diagnosis not present

## 2020-01-15 DIAGNOSIS — I708 Atherosclerosis of other arteries: Secondary | ICD-10-CM | POA: Diagnosis not present

## 2020-01-15 DIAGNOSIS — Z79899 Other long term (current) drug therapy: Secondary | ICD-10-CM | POA: Diagnosis not present

## 2020-01-15 DIAGNOSIS — Z7982 Long term (current) use of aspirin: Secondary | ICD-10-CM | POA: Diagnosis not present

## 2020-01-15 DIAGNOSIS — Z7902 Long term (current) use of antithrombotics/antiplatelets: Secondary | ICD-10-CM | POA: Diagnosis not present

## 2020-01-15 DIAGNOSIS — I700 Atherosclerosis of aorta: Secondary | ICD-10-CM | POA: Diagnosis not present

## 2020-01-15 DIAGNOSIS — M1990 Unspecified osteoarthritis, unspecified site: Secondary | ICD-10-CM | POA: Diagnosis not present

## 2020-01-15 DIAGNOSIS — Z8673 Personal history of transient ischemic attack (TIA), and cerebral infarction without residual deficits: Secondary | ICD-10-CM | POA: Diagnosis not present

## 2020-01-15 DIAGNOSIS — I10 Essential (primary) hypertension: Secondary | ICD-10-CM | POA: Diagnosis not present

## 2020-01-15 DIAGNOSIS — K429 Umbilical hernia without obstruction or gangrene: Secondary | ICD-10-CM | POA: Diagnosis not present

## 2020-01-26 DIAGNOSIS — Z8619 Personal history of other infectious and parasitic diseases: Secondary | ICD-10-CM | POA: Insufficient documentation

## 2020-01-26 DIAGNOSIS — A0472 Enterocolitis due to Clostridium difficile, not specified as recurrent: Secondary | ICD-10-CM | POA: Diagnosis not present

## 2020-02-06 DIAGNOSIS — M1711 Unilateral primary osteoarthritis, right knee: Secondary | ICD-10-CM | POA: Diagnosis not present

## 2020-02-06 DIAGNOSIS — Z96653 Presence of artificial knee joint, bilateral: Secondary | ICD-10-CM | POA: Diagnosis not present

## 2020-02-06 DIAGNOSIS — M1712 Unilateral primary osteoarthritis, left knee: Secondary | ICD-10-CM | POA: Diagnosis not present

## 2020-02-29 DIAGNOSIS — M8949 Other hypertrophic osteoarthropathy, multiple sites: Secondary | ICD-10-CM | POA: Diagnosis not present

## 2020-02-29 DIAGNOSIS — F411 Generalized anxiety disorder: Secondary | ICD-10-CM | POA: Diagnosis not present

## 2020-02-29 DIAGNOSIS — F5101 Primary insomnia: Secondary | ICD-10-CM | POA: Diagnosis not present

## 2020-02-29 DIAGNOSIS — E782 Mixed hyperlipidemia: Secondary | ICD-10-CM | POA: Diagnosis not present

## 2020-02-29 DIAGNOSIS — R7303 Prediabetes: Secondary | ICD-10-CM | POA: Diagnosis not present

## 2020-02-29 DIAGNOSIS — D649 Anemia, unspecified: Secondary | ICD-10-CM | POA: Diagnosis not present

## 2020-02-29 DIAGNOSIS — E538 Deficiency of other specified B group vitamins: Secondary | ICD-10-CM | POA: Diagnosis not present

## 2020-02-29 DIAGNOSIS — Z79899 Other long term (current) drug therapy: Secondary | ICD-10-CM | POA: Diagnosis not present

## 2020-02-29 DIAGNOSIS — E611 Iron deficiency: Secondary | ICD-10-CM | POA: Diagnosis not present

## 2020-02-29 DIAGNOSIS — R5383 Other fatigue: Secondary | ICD-10-CM | POA: Diagnosis not present

## 2020-02-29 DIAGNOSIS — I10 Essential (primary) hypertension: Secondary | ICD-10-CM | POA: Diagnosis not present

## 2020-02-29 DIAGNOSIS — Z8619 Personal history of other infectious and parasitic diseases: Secondary | ICD-10-CM | POA: Diagnosis not present

## 2020-02-29 DIAGNOSIS — R5381 Other malaise: Secondary | ICD-10-CM | POA: Diagnosis not present

## 2020-02-29 DIAGNOSIS — Z0000 Encounter for general adult medical examination without abnormal findings: Secondary | ICD-10-CM | POA: Diagnosis not present

## 2020-03-08 DIAGNOSIS — C439 Malignant melanoma of skin, unspecified: Secondary | ICD-10-CM | POA: Diagnosis not present

## 2020-03-08 DIAGNOSIS — C4361 Malignant melanoma of right upper limb, including shoulder: Secondary | ICD-10-CM | POA: Diagnosis not present

## 2020-03-08 DIAGNOSIS — M47814 Spondylosis without myelopathy or radiculopathy, thoracic region: Secondary | ICD-10-CM | POA: Diagnosis not present

## 2020-03-15 DIAGNOSIS — Z85820 Personal history of malignant melanoma of skin: Secondary | ICD-10-CM | POA: Diagnosis not present

## 2020-05-16 DIAGNOSIS — Z20828 Contact with and (suspected) exposure to other viral communicable diseases: Secondary | ICD-10-CM | POA: Diagnosis not present

## 2020-05-16 DIAGNOSIS — J069 Acute upper respiratory infection, unspecified: Secondary | ICD-10-CM | POA: Diagnosis not present

## 2020-05-16 DIAGNOSIS — R051 Acute cough: Secondary | ICD-10-CM | POA: Diagnosis not present

## 2020-05-21 DIAGNOSIS — R059 Cough, unspecified: Secondary | ICD-10-CM | POA: Diagnosis not present

## 2020-05-21 DIAGNOSIS — A09 Infectious gastroenteritis and colitis, unspecified: Secondary | ICD-10-CM | POA: Diagnosis not present

## 2020-08-27 DIAGNOSIS — M7062 Trochanteric bursitis, left hip: Secondary | ICD-10-CM | POA: Diagnosis not present

## 2020-09-03 DIAGNOSIS — F411 Generalized anxiety disorder: Secondary | ICD-10-CM | POA: Diagnosis not present

## 2020-09-03 DIAGNOSIS — D649 Anemia, unspecified: Secondary | ICD-10-CM | POA: Diagnosis not present

## 2020-09-03 DIAGNOSIS — E669 Obesity, unspecified: Secondary | ICD-10-CM | POA: Diagnosis not present

## 2020-09-03 DIAGNOSIS — R5381 Other malaise: Secondary | ICD-10-CM | POA: Diagnosis not present

## 2020-09-03 DIAGNOSIS — K219 Gastro-esophageal reflux disease without esophagitis: Secondary | ICD-10-CM | POA: Diagnosis not present

## 2020-09-03 DIAGNOSIS — M5136 Other intervertebral disc degeneration, lumbar region: Secondary | ICD-10-CM | POA: Diagnosis not present

## 2020-09-03 DIAGNOSIS — R7303 Prediabetes: Secondary | ICD-10-CM | POA: Diagnosis not present

## 2020-09-03 DIAGNOSIS — Z79899 Other long term (current) drug therapy: Secondary | ICD-10-CM | POA: Diagnosis not present

## 2020-09-03 DIAGNOSIS — M159 Polyosteoarthritis, unspecified: Secondary | ICD-10-CM | POA: Diagnosis not present

## 2020-09-03 DIAGNOSIS — I493 Ventricular premature depolarization: Secondary | ICD-10-CM | POA: Diagnosis not present

## 2020-09-03 DIAGNOSIS — I10 Essential (primary) hypertension: Secondary | ICD-10-CM | POA: Diagnosis not present

## 2020-09-03 DIAGNOSIS — E611 Iron deficiency: Secondary | ICD-10-CM | POA: Diagnosis not present

## 2020-09-03 DIAGNOSIS — E782 Mixed hyperlipidemia: Secondary | ICD-10-CM | POA: Diagnosis not present

## 2020-10-30 DIAGNOSIS — Z1231 Encounter for screening mammogram for malignant neoplasm of breast: Secondary | ICD-10-CM | POA: Diagnosis not present

## 2020-12-17 DIAGNOSIS — L820 Inflamed seborrheic keratosis: Secondary | ICD-10-CM | POA: Diagnosis not present

## 2020-12-17 DIAGNOSIS — L821 Other seborrheic keratosis: Secondary | ICD-10-CM | POA: Diagnosis not present

## 2020-12-17 DIAGNOSIS — L578 Other skin changes due to chronic exposure to nonionizing radiation: Secondary | ICD-10-CM | POA: Diagnosis not present

## 2020-12-17 DIAGNOSIS — Z85820 Personal history of malignant melanoma of skin: Secondary | ICD-10-CM | POA: Diagnosis not present

## 2020-12-17 DIAGNOSIS — D1801 Hemangioma of skin and subcutaneous tissue: Secondary | ICD-10-CM | POA: Diagnosis not present

## 2020-12-24 DIAGNOSIS — M7062 Trochanteric bursitis, left hip: Secondary | ICD-10-CM | POA: Diagnosis not present

## 2021-02-27 DIAGNOSIS — Z20828 Contact with and (suspected) exposure to other viral communicable diseases: Secondary | ICD-10-CM | POA: Diagnosis not present

## 2021-02-27 DIAGNOSIS — R0981 Nasal congestion: Secondary | ICD-10-CM | POA: Diagnosis not present

## 2021-02-27 DIAGNOSIS — R051 Acute cough: Secondary | ICD-10-CM | POA: Diagnosis not present

## 2021-03-11 NOTE — Progress Notes (Incomplete)
[facility]  [address] 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     She will continue to follow with her PCP, dermatologist and orthopedic surgeon. We will see her back in one year with repeat CBC, CMP, CXR and evaluation. She verbalizes understanding of and agreement to the plans discussed today. She knows to call the office should any new questions or concerns arise.   I provided *** minutes of face-to-face time during this this encounter and > 50% was spent counseling as documented under my assessment and plan.    Derwood Kaplan, MD [place] [address] [place] [address] Dept: [phone] Dept Fax: [phone]    CHIEF COMPLAINT:  CC: History of stage IIB malignant melanoma  Current Treatment:  Surveillance   HISTORY OF PRESENT ILLNESS:  Katrina Velasquez is a 80 y.o. 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  This was 5.4 mm deep with no ulceration and clear margins on wide excision.  She had a negative sentinel node and no evidence of metastatic disease.  She was found to have mild neutropenia in December 2015, but a CBC 2 weeks later was back to normal.  B12, folate, and serum protein electrophoresis were normal.  A chest x-ray, CBC and comprehensive metabolic panel done 6 months ago were normal.  She is here for routine followup and states she has been doing well.  She denies any concern for recurrence or any new concerning skin lesions.  She has chronic knee and back pain.  She denies any new complaints.  She states her appetite is good and she is trying to lose weight.  She had a screening  mammogram in April of this year, which did not reveal any evidence of malignancy.  She states she is up-to-date on screening colonoscopy.  She had a chest x-ray in July as a preoperative study and this was clear.  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  She is still undergoing physical therapy.   She is here for routine follow up, and states she has been doing well. She is planning for retirement soon. She is having some increasing pain to her left shoulder and is scheduled to see her orthopedic physician. She has had both Moderna COVID vaccines and is awaiting her booster. She has had her flu shot for this year. We reviewed labs from her primary care physician office which were unremarkable. We also reviewed her CXR from this month which is also unremarkable. She is scheduled with her dermatologist to re-establish care for continued follow up. She denies any new areas of concern. She denies fever, chills, nausea or vomiting. She denies shortness of breath, cough or chest pain.She denies issue with bowel or bladder. Her appetite and energy are good and her weight is decreased since last visit.   INTERVAL HISTORY:  I have reviewed her chart and materials related to her cancer extensively and collaborated history with the patient. Summary of oncologic history is as follows: Oncology History   No history exists.    Katrina Velasquez is here  for routine follow up ***. Annual bilateral mammogram from June was clear.   Her  appetite is good, and she has gained/lost _ pounds since her last visit.  She denies fever, chills or other signs of infection.  She denies nausea, vomiting, bowel issues, or abdominal pain.  She denies sore throat, cough, dyspnea, or chest pain.  HISTORY:   Allergies: Not on File  Current Medications: No current outpatient medications on file.   No current facility-administered medications for this visit.    REVIEW OF SYSTEMS:  Review of  Systems - Oncology   VITALS:  There were no vitals taken for this visit.  Wt Readings from Last 3 Encounters:  No data found for Wt    There is no height or weight on file to calculate BMI.  Performance status (ECOG): {CHL ONC [id number]  PHYSICAL EXAM:  Physical Exam  LABS:  No flowsheet data found. No flowsheet data found.  STUDIES:  No results found.   EXAM: 10/30/2020 DIGITAL SCREENING BILATERAL MAMMOGRAM WITH TOMOSYNTHESIS AND CAD  TECHNIQUE: Bilateral screening digital craniocaudal and mediolateral oblique mammograms were obtained. Bilateral screening digital breast tomosynthesis was performed. The images were evaluated with computer-aided detection.  COMPARISON: Previous exam(s).  ACR Breast Density Category c: The breast tissue is heterogeneously dense, which may obscure small masses.  FINDINGS: There are no findings suspicious for malignancy. The images were evaluated with computer-aided detection.  IMPRESSION: No mammographic evidence of malignancy. A result letter of this screening mammogram will be mailed directly to the patient.     I, Rita Ohara, am acting as scribe for Derwood Kaplan, MD  I have reviewed this report as typed by the medical scribe, and it is complete and accurate.

## 2021-03-15 ENCOUNTER — Ambulatory Visit: Payer: PPO | Admitting: Oncology

## 2021-03-18 ENCOUNTER — Inpatient Hospital Stay: Payer: PPO | Attending: Oncology | Admitting: Hematology and Oncology

## 2021-03-18 ENCOUNTER — Encounter: Payer: Self-pay | Admitting: Hematology and Oncology

## 2021-03-18 DIAGNOSIS — M1990 Unspecified osteoarthritis, unspecified site: Secondary | ICD-10-CM

## 2021-03-18 DIAGNOSIS — C439 Malignant melanoma of skin, unspecified: Secondary | ICD-10-CM | POA: Diagnosis not present

## 2021-03-18 NOTE — Progress Notes (Signed)
Patient Care Team: Raina Mina., MD as PCP - General (Internal Medicine)  Clinic Day:  03/18/2021  Referring physician: Raina Mina., MD  ASSESSMENT & PLAN:   Assessment & Plan: Melanoma of skin [facility] Malignant melanoma of the right shoulder diagnosed in August 2015 (stage IIIB). She remains without signs of recurrence. She is very diligent with her exams and continues follow up with dermatology. She is up to date on all screenings including mammogram and colonoscopy. She has received all COVID vaccines as well as the flu vaccine. She did develop COVID a few months ago with minimal symptoms. She will return to clinic in one year for repeat evaluation.    The patient understands the plans discussed today and is in agreement with them.  She knows to contact our office if she develops concerns prior to her next appointment.     Melodye Ped, NP  [facility] [address] [place] [address] Dept: [phone] Dept Fax: [phone]   No orders of the defined types were placed in this encounter.     CHIEF COMPLAINT:  CC: An 80 year old female with history of malignant melanoma here for annual exam  Current Treatment:  Surveillance  INTERVAL HISTORY:  Kyarra is here today for repeat clinical assessment. She denies fevers or chills. She denies pain. Her appetite is good. Her weight has been stable.  I have reviewed the past medical history, past surgical history, social history and family history with the patient and they are unchanged from previous note.  ALLERGIES:  is allergic to bee venom, ezetimibe, pravastatin sodium, and penicillin g.  MEDICATIONS:  Current Outpatient Medications  Medication Sig Dispense Refill   B Complex Vitamins (B-COMPLEX/B-12 PO) Take 1 tablet by mouth daily. Not sure on strenght     EPINEPHrine 0.3 mg/0.3 mL IJ SOAJ injection Inject 1 mg into the muscle Once PRN.      LORazepam (ATIVAN) 0.5 MG tablet Take 1 tablet by mouth daily.     rosuvastatin (CRESTOR) 5 MG tablet Take 1 tablet by mouth daily.     traZODone (DESYREL) 50 MG tablet Take 1 tablet by mouth as needed.     Ascorbic Acid 500 MG CAPS Take 1 tablet by mouth daily.     aspirin 81 MG EC tablet Take 1 tablet by mouth daily.     celecoxib (CELEBREX) 200 MG capsule Take 1 capsule by mouth daily.     Cholecalciferol 25 MCG (1000 UT) tablet Take 1 tablet by mouth daily.     clopidogrel (PLAVIX) 75 MG tablet Take 75 mg by mouth daily.     cyanocobalamin (,VITAMIN B-12,) 1000 MCG/ML injection Inject 1,000 mcg into the muscle every 30 (thirty) days.     doxazosin (CARDURA) 8 MG tablet Take 8 mg by mouth 2 (two) times daily.     hydrochlorothiazide (HYDRODIURIL) 25 MG tablet Take 25 mg by mouth daily.     losartan (COZAAR) 50 MG tablet Take 50 mg by mouth daily.     No current facility-administered medications for this visit.    HISTORY OF PRESENT ILLNESS:   Oncology History   No history exists.      REVIEW OF SYSTEMS:   Constitutional: Denies fevers, chills or abnormal weight loss Eyes: Denies blurriness of vision Ears, nose, mouth, throat, and face: Denies mucositis or sore throat Respiratory: Denies cough, dyspnea or wheezes Cardiovascular: Denies palpitation, chest discomfort or lower extremity swelling  Gastrointestinal:  Denies nausea, heartburn or change in bowel habits Skin: Denies abnormal skin rashes Lymphatics: Denies new lymphadenopathy or easy bruising Neurological:Denies numbness, tingling or new weaknesses Behavioral/Psych: Mood is stable, no new changes  All other systems were reviewed with the patient and are negative.   VITALS:  Blood pressure (!) 161/75, pulse 82, temperature 97.8 F (36.6 C), temperature source Oral, resp. rate 20, height 5' 6.6" (1.692 m), weight 218 lb 4.8 oz (99 kg), SpO2 99 %.  Wt Readings from Last 3 Encounters:  03/18/21 218 lb 4.8 oz (99 kg)     Body mass index is 34.6 kg/m.  Performance status (ECOG): 1 - Symptomatic but completely ambulatory  PHYSICAL EXAM:   GENERAL:alert, no distress and comfortable SKIN: skin color, texture, turgor are normal, no rashes or significant lesions EYES: normal, Conjunctiva are pink and non-injected, sclera clear OROPHARYNX:no exudate, no erythema and lips, buccal mucosa, and tongue normal  NECK: supple, thyroid normal size, non-tender, without nodularity LYMPH:  no palpable lymphadenopathy in the cervical, axillary or inguinal LUNGS: clear to auscultation and percussion with normal breathing effort HEART: regular rate & rhythm and no murmurs and no lower extremity edema ABDOMEN:abdomen soft, non-tender and normal bowel sounds Musculoskeletal:no cyanosis of digits and no clubbing  NEURO: alert & oriented x 3 with fluent speech, no focal motor/sensory deficits  LABORATORY DATA:  I have reviewed the data as listed No results found for: NA, K, CL, CO2, GLUCOSE, BUN, CREATININE, CALCIUM, PROT, ALBUMIN, AST, ALT, ALKPHOS, BILITOT, GFRNONAA, GFRAA  No results found for: SPEP, UPEP  No results found for: WBC, NEUTROABS, HGB, HCT, MCV, PLT    Chemistry   No results found for: NA, K, CL, CO2, BUN, CREATININE, GLU No results found for: CALCIUM, ALKPHOS, AST, ALT, BILITOT

## 2021-03-18 NOTE — Assessment & Plan Note (Signed)
Malignant melanoma of the right shoulder diagnosed in August 2015 (stage IIIB). She remains without signs of recurrence. She is very diligent with her exams and continues follow up with dermatology. She is up to date on all screenings including mammogram and colonoscopy. She has received all COVID vaccines as well as the flu vaccine. She did develop COVID a few months ago with minimal symptoms. She will return to clinic in one year for repeat evaluation.

## 2021-03-25 DIAGNOSIS — R5383 Other fatigue: Secondary | ICD-10-CM | POA: Diagnosis not present

## 2021-03-25 DIAGNOSIS — I493 Ventricular premature depolarization: Secondary | ICD-10-CM | POA: Diagnosis not present

## 2021-03-25 DIAGNOSIS — K219 Gastro-esophageal reflux disease without esophagitis: Secondary | ICD-10-CM | POA: Diagnosis not present

## 2021-03-25 DIAGNOSIS — E669 Obesity, unspecified: Secondary | ICD-10-CM | POA: Diagnosis not present

## 2021-03-25 DIAGNOSIS — R5381 Other malaise: Secondary | ICD-10-CM | POA: Diagnosis not present

## 2021-03-25 DIAGNOSIS — R7303 Prediabetes: Secondary | ICD-10-CM | POA: Diagnosis not present

## 2021-03-25 DIAGNOSIS — Z0000 Encounter for general adult medical examination without abnormal findings: Secondary | ICD-10-CM | POA: Diagnosis not present

## 2021-03-25 DIAGNOSIS — Z79899 Other long term (current) drug therapy: Secondary | ICD-10-CM | POA: Diagnosis not present

## 2021-03-25 DIAGNOSIS — M159 Polyosteoarthritis, unspecified: Secondary | ICD-10-CM | POA: Diagnosis not present

## 2021-03-25 DIAGNOSIS — E611 Iron deficiency: Secondary | ICD-10-CM | POA: Diagnosis not present

## 2021-03-25 DIAGNOSIS — I10 Essential (primary) hypertension: Secondary | ICD-10-CM | POA: Diagnosis not present

## 2021-03-25 DIAGNOSIS — M5136 Other intervertebral disc degeneration, lumbar region: Secondary | ICD-10-CM | POA: Diagnosis not present

## 2021-03-25 DIAGNOSIS — E782 Mixed hyperlipidemia: Secondary | ICD-10-CM | POA: Diagnosis not present

## 2021-03-25 DIAGNOSIS — F411 Generalized anxiety disorder: Secondary | ICD-10-CM | POA: Diagnosis not present

## 2021-03-25 DIAGNOSIS — E538 Deficiency of other specified B group vitamins: Secondary | ICD-10-CM | POA: Diagnosis not present

## 2021-03-25 DIAGNOSIS — D649 Anemia, unspecified: Secondary | ICD-10-CM | POA: Diagnosis not present

## 2021-04-15 DIAGNOSIS — M7062 Trochanteric bursitis, left hip: Secondary | ICD-10-CM | POA: Diagnosis not present

## 2022-02-23 ENCOUNTER — Inpatient Hospital Stay: Admit: 2022-02-23 | Payer: PPO | Admitting: Cardiology

## 2022-02-23 ENCOUNTER — Encounter (HOSPITAL_COMMUNITY): Payer: Self-pay

## 2022-03-18 ENCOUNTER — Inpatient Hospital Stay: Payer: PPO | Attending: Oncology | Admitting: Oncology

## 2022-03-18 ENCOUNTER — Encounter: Payer: Self-pay | Admitting: Oncology

## 2022-03-18 VITALS — BP 138/71 | HR 73 | Temp 97.4°F | Resp 19 | Ht 66.6 in | Wt 203.0 lb

## 2022-03-18 DIAGNOSIS — C439 Malignant melanoma of skin, unspecified: Secondary | ICD-10-CM | POA: Diagnosis not present

## 2022-03-18 NOTE — Progress Notes (Signed)
Patient Care Team: Raina Mina., MD as PCP - General (Internal Medicine)  Clinic Day:  03/18/2022  Referring physician: Raina Mina., MD  ASSESSMENT & PLAN:   Assessment & Plan: Malignant melanoma of the right shoulder No evidence of disease. She has had numerous X-Rays and labs recently.  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 Dr. Caswell Corwin is her cardiologist.  Plan: We will see her back in 1 year. I have suggested she see her dermatologist some time in the spring to re-check her skin. The patient understands the plans discussed today and is in agreement with them.  She knows to contact our office if she develops concerns prior to her next appointment.     Conard Novak  [facility] [address] [place] [address] Dept: [phone] Dept Fax: [phone]   No orders of the defined types were placed in this encounter.     CHIEF COMPLAINT:  CC: An 81 year old female with history of malignant melanoma here for annual exam  Current Treatment:  Surveillance  INTERVAL HISTORY:  Verity is here today for repeat clinical assessment. About 3 weeks on Sunday she was getting ready to go to the beach, and she started feeling like her chest was going to burst. She had no heart pain or arm pain. She called her son, who took her to the emergency room. She was found to have an acute myocardial infarction and transferred to [facility]. 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 She states she is doing a lot better from her surgery 3 weeks ago. Her daughter states she was fatigues and shad shortness of breath. She will be starting cardiac rehab next week. She stopped her HCTZ. She denies fevers or chills. She denies pain. Her appetite is good. Her weight has decreased 15 pounds. She missed her dermatology skin screening but I was able to check most of her skin surface and  found no suspicious lesions. I told her she could wait 6 months to reschedule that.   I have reviewed the past medical history, past surgical history, social history and family history with the patient and they are unchanged from previous note.  ALLERGIES:  is allergic to bee venom, ezetimibe, pravastatin sodium, and penicillin g.  MEDICATIONS:  Current Outpatient Medications  Medication Sig Dispense Refill   Ascorbic Acid 500 MG CAPS Take 1 tablet by mouth daily.     aspirin 81 MG EC tablet Take 1 tablet by mouth daily.     B Complex Vitamins (B-COMPLEX/B-12 PO) Take 1 tablet by mouth daily. Not sure on strenght     celecoxib (CELEBREX) 200 MG capsule Take 1 capsule by mouth daily.     Cholecalciferol 25 MCG (1000 UT) tablet Take 1 tablet by mouth daily.     clopidogrel (PLAVIX) 75 MG tablet Take 75 mg by mouth daily.     cyanocobalamin (,VITAMIN B-12,) 1000 MCG/ML injection Inject 1,000 mcg into the muscle every 30 (thirty) days.     doxazosin (CARDURA) 8 MG tablet Take 8 mg by mouth 2 (two) times daily.     EPINEPHrine 0.3 mg/0.3 mL IJ SOAJ injection Inject 1 mg into the muscle Once PRN.     hydrochlorothiazide (HYDRODIURIL) 25 MG tablet Take 25 mg by mouth daily.     LORazepam (ATIVAN) 0.5 MG tablet Take 1 tablet by mouth daily.     losartan (COZAAR) 50  MG tablet Take 50 mg by mouth daily.     rosuvastatin (CRESTOR) 5 MG tablet Take 1 tablet by mouth daily.     traZODone (DESYREL) 50 MG tablet Take 1 tablet by mouth as needed.     No current facility-administered medications for this visit.    HISTORY OF PRESENT ILLNESS:   Oncology History   No history exists.      REVIEW OF SYSTEMS:   Constitutional: Denies fevers, chills or abnormal weight loss Eyes: Denies blurriness of vision Ears, nose, mouth, throat, and face: Denies mucositis or sore throat Respiratory: Denies cough, dyspnea or wheezes Cardiovascular: Denies palpitation, chest discomfort or lower extremity  swelling Gastrointestinal:  Denies nausea, heartburn or change in bowel habits Skin: Denies abnormal skin rashes Lymphatics: Denies new lymphadenopathy or easy bruising Neurological:Denies numbness, tingling or new weaknesses Behavioral/Psych: Mood is stable, no new changes  All other systems were reviewed with the patient and are negative.   VITALS:  There were no vitals taken for this visit.  Wt Readings from Last 3 Encounters:  03/18/21 218 lb 4.8 oz (99 kg)    There is no height or weight on file to calculate BMI.  Performance status (ECOG): 1 - Symptomatic but completely ambulatory  PHYSICAL EXAM:   GENERAL:alert, no distress and comfortable SKIN: skin color, texture, turgor are normal, no rashes or significant lesions EYES: normal, Conjunctiva are pink and non-injected, sclera clear OROPHARYNX:no exudate, no erythema and lips, buccal mucosa, and tongue normal  NECK: supple, thyroid normal size, non-tender, without nodularity LYMPH:  no palpable lymphadenopathy in the cervical, axillary or inguinal LUNGS: clear to auscultation and percussion with normal breathing effort HEART: regular rate & rhythm and no murmurs and no lower extremity edema ABDOMEN:abdomen soft, non-tender and normal bowel sounds Musculoskeletal:no cyanosis of digits and no clubbing  NEURO: alert & oriented x 3 with fluent speech, no focal motor/sensory deficits Notes: She has a large sternal scar which is healing well. She has 3 chest tube sites in the anterior abdomen which are also healing well but feel slightly nodular.    LABORATORY DATA:  I have reviewed the data as listed No results found for: "NA", "K", "CL", "CO2", "GLUCOSE", "BUN", "CREATININE", "CALCIUM", "PROT", "ALBUMIN", "AST", "ALT", "ALKPHOS", "BILITOT", "GFRNONAA", "GFRAA"  No results found for: "SPEP", "UPEP"  No results found for: "WBC", "NEUTROABS", "HGB", "HCT", "MCV", "PLT"    Chemistry   No results found for: "NA", "K", "CL",  "CO2", "BUN", "CREATININE", "GLU" No results found for: "CALCIUM", "ALKPHOS", "AST", "ALT", "BILITOT"    I,Gabriella Ballesteros,acting as a scribe for Derwood Kaplan, MD.,have documented all relevant documentation on the behalf of Derwood Kaplan, MD,as directed by  Derwood Kaplan, MD while in the presence of Derwood Kaplan, MD.

## 2022-12-02 LAB — HM MAMMOGRAPHY

## 2023-03-19 ENCOUNTER — Ambulatory Visit: Payer: PPO | Admitting: Oncology

## 2023-03-24 ENCOUNTER — Ambulatory Visit: Payer: PPO | Admitting: Oncology

## 2023-03-26 NOTE — Progress Notes (Incomplete)
Patient Care Team: Katrina Clark, NP as PCP - General (Internal Medicine)  Clinic Day:  03/26/2023  Referring physician: Rhea Velasquez*  ASSESSMENT & PLAN:  Assessment & Plan: Malignant melanoma of the right shoulder No evidence of disease. She has had numerous X-Rays and labs recently.  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 Dr. Ocie Doyne is her cardiologist.  Plan: We will see her back in 1 year. I have suggested she see her dermatologist some time in the spring to re-check her skin. The patient understands the plans discussed today and is in agreement with them.  She knows to contact our office if she develops concerns prior to her next appointment.   Katrina Velasquez [facility] AT [facility] [address] [place] [address] Dept: [phone] Dept Fax: [phone]   No orders of the defined types were placed in this encounter.    CHIEF COMPLAINT:  CC: History of malignant melanoma   Current Treatment:  Surveillance  INTERVAL HISTORY:  Katrina Velasquez is here today for repeat clinical assessment for her history of malignant melanoma. Patient states that she feels *** and ***.     She denies signs of infection such as sore throat, sinus drainage, cough, or urinary symptoms.  She denies fevers or recurrent chills. She denies pain. She denies nausea, vomiting, chest pain, dyspnea or cough. Her appetite is *** and her weight {Weight change:10426}.   Marland Kitchen About 3 weeks on Sunday she was getting ready to go to the beach, and she started feeling like her chest was going to burst. She had no heart pain or arm pain. She called her son, who took her to the emergency room. She was found to have an acute myocardial infarction and transferred to [facility]. 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 She states she is doing a lot better from her surgery 3 weeks ago. Her daughter  states she was fatigues and shad shortness of breath. She will be starting cardiac rehab next week. She stopped her HCTZ. She denies fevers or chills. She denies pain. Her appetite is good. Her weight has decreased 15 pounds. She missed her dermatology skin screening but I was able to check most of her skin surface and found no suspicious lesions. I told her she could wait 6 months to reschedule that.   I have reviewed the past medical history, past surgical history, social history and family history with the patient and they are unchanged from previous note.  ALLERGIES:  is allergic to bee venom, ezetimibe, pravastatin sodium, and penicillin g.  MEDICATIONS:  Current Outpatient Medications  Medication Sig Dispense Refill   Ascorbic Acid 500 MG CAPS Take 1 tablet by mouth daily.     aspirin 81 MG EC tablet Take 1 tablet by mouth daily.     celecoxib (CELEBREX) 200 MG capsule Take 1 capsule by mouth daily.     clonazePAM (KLONOPIN) 0.5 MG tablet Take 0.5 mg by mouth at bedtime as needed.     clopidogrel (PLAVIX) 75 MG tablet Take 75 mg by mouth daily.     cyanocobalamin (,VITAMIN B-12,) 1000 MCG/ML injection Inject 1,000 mcg into the muscle every 30 (thirty) days.     EPINEPHrine 0.3 mg/0.3 mL IJ SOAJ injection Inject 1 mg into the muscle Once PRN.     hydrochlorothiazide (HYDRODIURIL) 25 MG tablet Take 25 mg by mouth daily. (Patient not taking: Reported on  03/18/2022)     LORazepam (ATIVAN) 0.5 MG tablet Take 1 tablet by mouth daily.     losartan (COZAAR) 50 MG tablet Take 50 mg by mouth daily.     metoprolol tartrate (LOPRESSOR) 25 MG tablet Take 12.5 mg by mouth 2 (two) times daily.     rosuvastatin (CRESTOR) 5 MG tablet Take 1 tablet by mouth daily.     traZODone (DESYREL) 50 MG tablet Take 1 tablet by mouth as needed.     No current facility-administered medications for this visit.    HISTORY OF PRESENT ILLNESS:   Oncology History  Melanoma of skin (HCC)  12/31/2013 Cancer Staging    Staging form: Melanoma of the Skin, AJCC 8th Edition - Clinical stage from 12/31/2013: Stage IIB (cT4a, cN0, cM0) - Signed by Dellia Beckwith, MD on 08/14/2022 Histopathologic type: Malignant melanoma, NOS (except juvenile melanoma M-8770/[id number]) Stage prefix: Initial diagnosis Laterality: Right Diagnostic confirmation: Positive histology PLUS positive immunophenotyping and/or positive genetic studies Specimen type: Excision Staged by: Managing physician Breslow depth (mm): 5.4 Ulceration of the epidermis: No Stage used in treatment planning: Yes National guidelines used in treatment planning: Yes Type of national guideline used in treatment planning: NCCN   03/18/2021 Initial Diagnosis   Melanoma of skin (HCC)    REVIEW OF SYSTEMS:  Review of Systems  Constitutional: Negative.  Negative for appetite change, chills, diaphoresis, fatigue, fever and unexpected weight change.  HENT:  Negative.  Negative for hearing loss, lump/mass, mouth sores, nosebleeds, sore throat, tinnitus, trouble swallowing and voice change.   Eyes: Negative.  Negative for eye problems and icterus.  Respiratory: Negative.  Negative for chest tightness, cough, hemoptysis, shortness of breath and wheezing.   Cardiovascular: Negative.  Negative for chest pain, leg swelling and palpitations.  Gastrointestinal: Negative.  Negative for abdominal distention, abdominal pain, blood in stool, constipation, diarrhea, nausea, rectal pain and vomiting.  Endocrine: Negative.   Genitourinary: Negative.  Negative for bladder incontinence, difficulty urinating, dyspareunia, dysuria, frequency, hematuria, menstrual problem, nocturia, pelvic pain, vaginal bleeding and vaginal discharge.   Musculoskeletal: Negative.  Negative for arthralgias, back pain, flank pain, gait problem, myalgias, neck pain and neck stiffness.  Skin: Negative.  Negative for itching, rash and wound.  Neurological:  Negative for dizziness, extremity weakness, gait  problem, headaches, light-headedness, numbness, seizures and speech difficulty.  Hematological: Negative.  Negative for adenopathy. Does not bruise/bleed easily.  Psychiatric/Behavioral: Negative.  Negative for confusion, decreased concentration, depression, sleep disturbance and suicidal ideas. The patient is not nervous/anxious.    VITALS:  There were no vitals taken for this visit.  Wt Readings from Last 3 Encounters:  03/18/22 203 lb (92.1 kg)  03/18/21 218 lb 4.8 oz (99 kg)    There is no height or weight on file to calculate BMI.  Performance status (ECOG): 1 - Symptomatic but completely ambulatory  PHYSICAL EXAM:  Physical Exam Vitals and nursing note reviewed.  Constitutional:      General: She is not in acute distress.    Appearance: Normal appearance. She is normal weight. She is not ill-appearing, toxic-appearing or diaphoretic.  HENT:     Head: Normocephalic and atraumatic.     Right Ear: Tympanic membrane, ear canal and external ear normal. There is no impacted cerumen.     Left Ear: Tympanic membrane, ear canal and external ear normal. There is no impacted cerumen.     Nose: Nose normal. No congestion or rhinorrhea.     Mouth/Throat:     Mouth:  Mucous membranes are moist.     Pharynx: Oropharynx is clear. No oropharyngeal exudate or posterior oropharyngeal erythema.  Eyes:     General: No scleral icterus.       Right eye: No discharge.        Left eye: No discharge.     Extraocular Movements: Extraocular movements intact.     Conjunctiva/sclera: Conjunctivae normal.     Pupils: Pupils are equal, round, and reactive to light.  Neck:     Vascular: No carotid bruit.  Cardiovascular:     Rate and Rhythm: Normal rate and regular rhythm.     Pulses: Normal pulses.     Heart sounds: Normal heart sounds. No murmur heard.    No friction rub. No gallop.  Pulmonary:     Effort: Pulmonary effort is normal. No respiratory distress.     Breath sounds: Normal breath sounds.  No stridor. No wheezing, rhonchi or rales.  Chest:     Chest wall: No tenderness.  Abdominal:     General: Bowel sounds are normal. There is no distension.     Palpations: Abdomen is soft. There is no hepatomegaly, splenomegaly or mass.     Tenderness: There is no abdominal tenderness. There is no right CVA tenderness, left CVA tenderness, guarding or rebound.     Hernia: No hernia is present.     Comments: She has a large sternal scar which is healing well. She has 3 chest tube sites in the anterior abdomen which are also healing well but feel slightly nodular.   Musculoskeletal:        General: No swelling, tenderness, deformity or signs of injury. Normal range of motion.     Cervical back: Normal range of motion and neck supple. No rigidity or tenderness.     Right lower leg: No edema.     Left lower leg: No edema.  Lymphadenopathy:     Cervical: No cervical adenopathy.     Right cervical: No superficial, deep or posterior cervical adenopathy.    Left cervical: No superficial, deep or posterior cervical adenopathy.     Upper Body:     Right upper body: No supraclavicular, axillary or pectoral adenopathy.     Left upper body: No supraclavicular, axillary or pectoral adenopathy.  Skin:    General: Skin is warm and dry.     Coloration: Skin is not jaundiced or pale.     Findings: No bruising, erythema, lesion or rash.  Neurological:     General: No focal deficit present.     Mental Status: She is alert and oriented to person, place, and time. Mental status is at baseline.     Cranial Nerves: No cranial nerve deficit.     Sensory: No sensory deficit.     Motor: No weakness.     Coordination: Coordination normal.     Gait: Gait normal.     Deep Tendon Reflexes: Reflexes normal.  Psychiatric:        Mood and Affect: Mood normal.        Behavior: Behavior normal.        Thought Content: Thought content normal.        Judgment: Judgment normal.    LABORATORY DATA:  I have  reviewed the data as listed Component Ref Range & Units 05/22/2022  WBC 4.4 - 11.0 x 10*3/uL 8.1  RBC 4.10 - 5.10 x 10*6/uL 3.59 Low   Hemoglobin 12.3 - 15.3 G/DL 16.1 Low   Hematocrit 09.6 -  44.6 % 30.7 Low   MCV 80.0 - 96.0 FL 85.5  MCH 27.5 - 33.2 PG 28.5  MCHC 33.0 - 37.0 G/DL 42.5  RDW 95.6 - 38.7 % 14.5  Platelets 150 - 450 X 10*3/uL 274   Component Ref Range & Units 05/22/2022  Sodium 135 - 146 MMOL/L 133 Low   Potassium 3.5 - 5.3 MMOL/L 3.4 Low   Chloride 98 - 110 MMOL/L 99  CO2 23 - 30 MMOL/L 27  BUN 8 - 24 MG/DL 29 High   Glucose 70 - 99 MG/DL 564 High   Creatinine 3.32 - 1.50 MG/DL 9.51  Calcium 8.5 - 88.4 MG/DL 8.9  Total Protein 6.0 - 8.3 G/DL 6.4  Albumin 3.5 - 5.0 G/DL 3.8  Total Bilirubin 0.1 - 1.2 MG/DL 0.4  Alkaline Phosphatase 25 - 125 IU/L or U/L 97  AST (SGOT) 5 - 40 IU/L or U/L 27  ALT (SGPT) 5 - 50 IU/L or U/L 17   Component Ref Range & Units 05/21/2022  Transferrin 212 - 360 MG/DL 166  Iron 28 - 063 mcg/dL 61  Ferritin 11 - 016 NG/ML 12  TIBC 250 - 435 mcg/dL 010  UIBC 932 - 355 mcg/dL 732  % Saturation 15 - 50 % 15   Component Ref Range & Units 05/21/2022  TSH 0.45 - 5.00 UIU/ML 1.10    No results found for: "NA", "K", "CL", "CO2", "GLUCOSE", "BUN", "CREATININE", "CALCIUM", "PROT", "ALBUMIN", "AST", "ALT", "ALKPHOS", "BILITOT", "GFRNONAA", "GFRAA"  No results found for: "SPEP", "UPEP"  No results found for: "WBC", "NEUTROABS", "HGB", "HCT", "MCV", "PLT"    Chemistry   No results found for: "NA", "K", "CL", "CO2", "BUN", "CREATININE", "GLU" No results found for: "CALCIUM", "ALKPHOS", "AST", "ALT", "BILITOT"   STUDIES: No Studies Found.      I,Jasmine M Lassiter,acting as a scribe for Dellia Beckwith, MD.,have documented all relevant documentation on the behalf of Dellia Beckwith, MD,as directed by  Dellia Beckwith, MD while in the presence of Dellia Beckwith, MD.

## 2023-04-08 ENCOUNTER — Ambulatory Visit: Payer: PPO | Admitting: Oncology

## 2023-05-25 NOTE — Progress Notes (Signed)
 [facility]  [address] [place],  [place]  [address] [phone]  Clinic Day:  05/26/2023  Referring physician: Benson Eleanor PARAS*   CHIEF COMPLAINT:  CC: History of stage IIB malignant melanoma  Current Treatment: Surveillance  HISTORY OF PRESENT ILLNESS:  Katrina Velasquez is a 83 y.o. 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  This was 5.4 mm deep with no ulceration and clear margins on wide excision.  She had a negative sentinel node and no evidence of metastatic disease.  She has remained on surveillance.  She was found to have mild neutropenia in December 2015, but a CBC 2 weeks later was back to normal.     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 Chest x-ray at that time was negative.  She states she is up-to-date on screening colonoscopy. She had not been seeing her dermatologist regularly, so we recommended she be seen last year.  Oncology History  Melanoma of skin (HCC)  12/31/2013 Cancer Staging   Staging form: Melanoma of the Skin, AJCC 8th Edition - Clinical stage from 12/31/2013: Stage IIB (cT4a, cN0, cM0) - Signed by Cornelius Wanda DEL, MD on 08/14/2022 Histopathologic type: Malignant melanoma, NOS (except juvenile melanoma M-8770/0) Stage prefix: Initial diagnosis Laterality: Right Diagnostic confirmation: Positive histology PLUS positive immunophenotyping and/or positive genetic studies Specimen type: Excision Staged by: Managing physician Breslow depth (mm): 5.4 Ulceration of the epidermis: No Stage used in treatment planning: Yes National guidelines used in treatment planning: Yes Type of national guideline used in treatment planning: NCCN   03/18/2021 Initial Diagnosis   Melanoma of skin (HCC)       INTERVAL HISTORY:  Katrina Velasquez is  here for annual follow-up. She is concerned about her daughter who is the sole caregiver for her son-in-law.  She denies any concerning skin lesions.  She denies fevers or chills. She denies pain. Her appetite is good. Her weight has increased 2 pounds over last 15 months .  Since her last visit, she saw her dermatologist and had a few facial lesions treated with cryotherapy. She also had a biopsy of a dark mole on her left hand, which was apparently negative. She is scheduled for annual follow up with dermatology again.  She was seen at [facility] ED in January 2024 with chest pain.  Troponins were normal. Additional evaluation did not reveal any cardiac etiology.  She continues to follow with Dr. Raylene.  She has routine labs done at her PCP office and CBC and CMP in July were unremarkable. Bilateral screening mammogram in July 2024 did not reveal any evidence of malignancy  REVIEW OF SYSTEMS:  Review of Systems  Constitutional:  Negative for appetite change, chills, diaphoresis, fatigue, fever and unexpected weight change.  HENT:   Negative for lump/mass, mouth sores, nosebleeds and sore throat.   Respiratory:  Negative for cough, hemoptysis and shortness of breath.   Cardiovascular:  Negative for chest pain and leg swelling.  Gastrointestinal:  Negative for abdominal pain, blood in stool, constipation, diarrhea, nausea and vomiting.  Endocrine: Negative for hot flashes.  Genitourinary:  Negative for difficulty urinating, dysuria, frequency, hematuria and vaginal bleeding.   Musculoskeletal:  Negative for arthralgias, back pain and myalgias.  Skin:  Negative for itching, rash and wound.  Neurological:  Negative for dizziness and headaches.  Hematological:  Negative for adenopathy. Bruises/bleeds easily (easy brusining on Plavix and aspirin).  Psychiatric/Behavioral:  Negative for depression and sleep disturbance. The patient is not nervous/anxious.      VITALS:  Blood pressure (!)  156/59, pulse (!) 59, temperature 98.8 F (37.1 C), temperature source Oral, resp. rate 18, height 5' 7 (1.702 m), weight 205 lb 11.2 oz (93.3 kg), SpO2 100%.  Wt Readings from Last 3 Encounters:  05/26/23 205 lb 11.2 oz (93.3 kg)  03/18/22 203 lb (92.1 kg)  03/18/21 218 lb 4.8 oz (99 kg)    Body mass index is 32.22 kg/m.  Performance status (ECOG): 0 - Asymptomatic  PHYSICAL EXAM:  Physical Exam Vitals and nursing note reviewed.  Constitutional:      General: She is not in acute distress.    Appearance: Normal appearance. She is not ill-appearing.  HENT:     Head: Normocephalic and atraumatic.     Mouth/Throat:     Mouth: Mucous membranes are moist.     Pharynx: Oropharynx is clear. No oropharyngeal exudate or posterior oropharyngeal erythema.  Eyes:     General: No scleral icterus.    Extraocular Movements: Extraocular movements intact.     Conjunctiva/sclera: Conjunctivae normal.     Pupils: Pupils are equal, round, and reactive to light.  Cardiovascular:     Rate and Rhythm: Normal rate and regular rhythm.     Heart sounds: Normal heart sounds. No murmur heard.    No friction rub. No gallop.  Pulmonary:     Effort: Pulmonary effort is normal.     Breath sounds: Normal breath sounds. No wheezing, rhonchi or rales.  Abdominal:     General: There is no distension.     Palpations: Abdomen is soft. There is no hepatomegaly, splenomegaly or mass.     Tenderness: There is no abdominal tenderness.  Musculoskeletal:        General: Normal range of motion.     Cervical back: Normal range of motion and neck supple. No tenderness.     Right lower leg: No edema.     Left lower leg: No edema.  Lymphadenopathy:     Cervical: No cervical adenopathy.     Upper Body:     Right upper body: No supraclavicular or axillary adenopathy.     Left upper body: No supraclavicular or axillary adenopathy.     Lower Body: No right inguinal adenopathy. No left inguinal adenopathy.  Skin:     General: Skin is warm and dry.     Coloration: Skin is not jaundiced.     Findings: Bruising (few resolving bruises abdomen) and ecchymosis (dorsum left hand) present. No lesion or rash.  Neurological:     Mental Status: She is alert and oriented to person, place, and time.     Cranial Nerves: No cranial nerve deficit.  Psychiatric:        Mood and Affect: Mood normal.        Behavior: Behavior normal.        Thought Content: Thought content normal.     LABS:       No data to display             No data to display         STUDIES:  No results found.    HISTORY:  No past medical history on file.  Past Surgical History:  Procedure Laterality Date   ABDOMINAL HYSTERECTOMY  left ankle fracture     REPLACEMENT TOTAL KNEE BILATERAL      No family history on file.  Social History:  reports that she has never smoked. She has never used smokeless tobacco. She reports that she does not drink alcohol and does not use drugs.The patient is alone today.  Allergies:  Allergies  Allergen Reactions   Bee Venom Swelling and Anaphylaxis   Ezetimibe Other (See Comments)    Leg cramps  Muscle cramps  unknown   Pravastatin Sodium Other (See Comments)    Muscle weakness  unknown   Penicillin G Rash and Other (See Comments)    Unknown    Current Medications: Current Outpatient Medications  Medication Sig Dispense Refill   celecoxib (CELEBREX) 200 MG capsule Take 200 mg by mouth as needed.     hydrochlorothiazide (HYDRODIURIL) 25 MG tablet Take 25 mg by mouth daily.     nitroGLYCERIN (NITROSTAT) 0.4 MG SL tablet 0.4 mg every 5 (five) minutes as needed.     rosuvastatin (CRESTOR) 10 MG tablet Take 10 mg by mouth daily.     Ascorbic Acid 500 MG CAPS Take 1 tablet by mouth daily.     aspirin 81 MG EC tablet Take 1 tablet by mouth daily.     clonazePAM (KLONOPIN) 0.5 MG tablet Take 0.5 mg by mouth at bedtime as needed.     clopidogrel (PLAVIX) 75 MG tablet Take 75 mg by  mouth daily.     cyanocobalamin (,VITAMIN B-12,) 1000 MCG/ML injection Inject 1,000 mcg into the muscle every 30 (thirty) days.     EPINEPHrine 0.3 mg/0.3 mL IJ SOAJ injection Inject 1 mg into the muscle Once PRN.     losartan (COZAAR) 50 MG tablet Take 50 mg by mouth daily.     metoprolol tartrate (LOPRESSOR) 25 MG tablet Take 12.5 mg by mouth 2 (two) times daily.     No current facility-administered medications for this visit.     ASSESSMENT & PLAN:   Assessment & Plan: Katrina Velasquez is a 83 y.o. female with a history of stage IIB malignant melanoma of the right shoulder. She remains without evidence of recurrence. She does not have any new concerning lesions. She knows to follow up with dermatology as scheduled.  We will plan to see her back in 1 year for re-examination.  The patient understands the plans discussed today and is in agreement with them.  She knows to contact our office if she develops concerns prior to her next appointment.     I provided 15 minutes of face-to-face time during this encounter and > 50% was spent counseling as documented under my assessment and plan.    Jmichael Gille A Salvatrice Morandi, PA-C  [facility] [place] - A DEPT OF [facility]. [facility] [address] [place] [address] Dept: [phone] Dept Fax: [phone]   No orders of the defined types were placed in this encounter.

## 2023-05-26 ENCOUNTER — Telehealth: Payer: Self-pay

## 2023-05-26 ENCOUNTER — Inpatient Hospital Stay: Payer: PPO | Attending: Hematology and Oncology | Admitting: Hematology and Oncology

## 2023-05-26 ENCOUNTER — Encounter: Payer: Self-pay | Admitting: Hematology and Oncology

## 2023-05-26 VITALS — BP 156/59 | HR 59 | Temp 98.8°F | Resp 18 | Ht 67.0 in | Wt 205.7 lb

## 2023-05-26 DIAGNOSIS — C439 Malignant melanoma of skin, unspecified: Secondary | ICD-10-CM | POA: Diagnosis not present

## 2023-05-26 DIAGNOSIS — Z85820 Personal history of malignant melanoma of skin: Secondary | ICD-10-CM | POA: Insufficient documentation

## 2023-05-26 NOTE — Telephone Encounter (Signed)
 Spoke with [facility] they will fax report.

## 2023-05-26 NOTE — Telephone Encounter (Signed)
-----   Message from Adah Perl sent at 05/26/2023 10:33 AM EST ----- Please request pathology from [facility] dermatology of left hand biopsy. Thanks

## 2023-06-09 ENCOUNTER — Encounter: Payer: Self-pay | Admitting: Specialist

## 2023-12-04 ENCOUNTER — Encounter: Payer: Self-pay | Admitting: Advanced Practice Midwife

## 2024-05-25 ENCOUNTER — Inpatient Hospital Stay: Payer: PPO | Admitting: Oncology

## 2024-05-25 DIAGNOSIS — C439 Malignant melanoma of skin, unspecified: Secondary | ICD-10-CM

## 2024-06-16 ENCOUNTER — Inpatient Hospital Stay: Attending: Oncology | Admitting: Oncology

## 2024-06-16 ENCOUNTER — Other Ambulatory Visit: Payer: Self-pay | Admitting: Oncology

## 2024-06-16 ENCOUNTER — Encounter: Payer: Self-pay | Admitting: Oncology

## 2024-06-16 VITALS — BP 156/51 | HR 69 | Temp 98.1°F | Resp 18 | Ht 67.0 in | Wt 211.4 lb

## 2024-06-16 DIAGNOSIS — C439 Malignant melanoma of skin, unspecified: Secondary | ICD-10-CM

## 2024-06-16 DIAGNOSIS — Z85820 Personal history of malignant melanoma of skin: Secondary | ICD-10-CM | POA: Diagnosis present

## 2024-06-16 DIAGNOSIS — S62101D Fracture of unspecified carpal bone, right wrist, subsequent encounter for fracture with routine healing: Secondary | ICD-10-CM

## 2024-07-13 ENCOUNTER — Other Ambulatory Visit (HOSPITAL_BASED_OUTPATIENT_CLINIC_OR_DEPARTMENT_OTHER): Admitting: Radiology

## 2025-06-16 ENCOUNTER — Inpatient Hospital Stay: Admitting: Oncology
